# Patient Record
Sex: Male | Born: 2000 | Race: White | Hispanic: No | Marital: Single | Smoking: Never smoker
Health system: Southern US, Community
[De-identification: ages and names within clinical notes are randomized; demographics above are authoritative.]

## PROBLEM LIST (undated history)

## (undated) DIAGNOSIS — Q676 Pectus excavatum: Secondary | ICD-10-CM

## (undated) DIAGNOSIS — R55 Syncope and collapse: Secondary | ICD-10-CM

## (undated) DIAGNOSIS — F419 Anxiety disorder, unspecified: Secondary | ICD-10-CM

## (undated) DIAGNOSIS — R51 Headache: Secondary | ICD-10-CM

## (undated) DIAGNOSIS — F32A Depression, unspecified: Secondary | ICD-10-CM

## (undated) DIAGNOSIS — G47419 Narcolepsy without cataplexy: Secondary | ICD-10-CM

## (undated) DIAGNOSIS — F329 Major depressive disorder, single episode, unspecified: Secondary | ICD-10-CM

## (undated) HISTORY — DX: Depression, unspecified: F32.A

## (undated) HISTORY — DX: Headache: R51

## (undated) HISTORY — DX: Syncope and collapse: R55

## (undated) HISTORY — DX: Anxiety disorder, unspecified: F41.9

## (undated) HISTORY — DX: Narcolepsy without cataplexy: G47.419

## (undated) HISTORY — DX: Major depressive disorder, single episode, unspecified: F32.9

## (undated) HISTORY — DX: Pectus excavatum: Q67.6

---
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
## 2019-01-21 ENCOUNTER — Telehealth: Payer: Self-pay | Admitting: Family Medicine

## 2019-01-22 ENCOUNTER — Ambulatory Visit (INDEPENDENT_AMBULATORY_CARE_PROVIDER_SITE_OTHER): Payer: Managed Care, Other (non HMO) | Admitting: Family Medicine

## 2019-01-22 ENCOUNTER — Ambulatory Visit: Payer: Self-pay | Admitting: Family Medicine

## 2019-01-22 ENCOUNTER — Encounter: Payer: Self-pay | Admitting: Family Medicine

## 2019-01-22 VITALS — BP 131/61 | HR 79 | Temp 98.7°F | Resp 20 | Ht 70.5 in | Wt 184.0 lb

## 2019-01-22 DIAGNOSIS — IMO0002 Reserved for concepts with insufficient information to code with codable children: Secondary | ICD-10-CM

## 2019-01-22 DIAGNOSIS — F411 Generalized anxiety disorder: Secondary | ICD-10-CM | POA: Insufficient documentation

## 2019-01-22 DIAGNOSIS — Z915 Personal history of self-harm: Secondary | ICD-10-CM | POA: Diagnosis not present

## 2019-01-22 DIAGNOSIS — F339 Major depressive disorder, recurrent, unspecified: Secondary | ICD-10-CM | POA: Insufficient documentation

## 2019-01-22 MED ORDER — FLUOXETINE HCL 20 MG PO TABS: 20.0000 mg | ORAL_TABLET | Freq: Every day | ORAL | 3 refills | Status: DC

## 2019-01-22 NOTE — Progress Notes (Addendum)
Subjective:  Patient ID: Dustin Pace, male    DOB: [birth year], 18 y.o.   MRN: [id number]  Patient Care Team: Sonny Masters, FNP as PCP - General (Family Medicine)   Chief Complaint:  Medical Management of Chronic Issues (anxiety (attacks -more often -once every 2 wks ) )   HPI: Dustin Pace is a 18 y.o. male presenting on 01/22/2019 for Medical Management of Chronic Issues (anxiety (attacks -more often -once every 2 wks ) )  Pt presents today for follow up for anxiety and depression. Pt states he has been seen in the past by Dr. Oswaldo Pace and at [facility]. States the woman he was seeing at [facility] left and he did not feel comfortable with the new provider so he stopped going about 1 year ago. States he stopped taking his medications at this time as well. He feels he has had a significant increase in his symptoms. States he started having depression and anxiety symptoms around age 28. States his mother and father both have a history of the same. Not sure if parents are taking medications. He states his symptoms have become more frequent and are interfering with his daily life. States he feels helpless which leads to frustration, anxiety, and anger on a daily basis. He is able to maintain his full time job, he is currently working days and lives with a roommate who is also a Radio broadcast assistant. States his roommate is a great support person and is aware of his illness. States he also sees other friends at least every 1-2 weeks. He has online friends with whom he plays video games with.  He reports an incident which occurred about 1.5 years ago. States he was very sad and started drinking alcohol which increased his sadness. States he needed a release so he cut himself on the face and shoulder. He was not seen after this incident. States he has thoughts that it would be better if her were not here but denies suicidal plan or means of carrying out any plans. He states he feels the cutting episode  was more of a release for his and his anger, sadness, and frustrations. He denies recent cutting episodes. Pt voices safety to himself and others. No current SI or HI.   Depression      The patient presents with depression.  This is a recurrent problem.  The current episode started more than 1 year ago.   The problem has been gradually worsening since onset.  Associated symptoms include decreased concentration, fatigue, helplessness, hopelessness, insomnia, irritable, restlessness, decreased interest, appetite change and sad.  Associated symptoms include no body aches, no myalgias, no headaches, no indigestion and no suicidal ideas.     The symptoms are aggravated by family issues, work stress, medication withdrawal and social issues.  Past treatments include SSRIs - Selective serotonin reuptake inhibitors and other medications.  Compliance with treatment is poor.  Risk factors include family history, family history of mental illness, alcohol intake, a change in medication usage/dosage, history of self-injury, history of mental illness, the patient not taking medications correctly and stress.   Past medical history includes anxiety, depression and mental health disorder.   Anxiety Presents for follow-up visit. Symptoms include compulsions, decreased concentration, depressed mood, excessive worry, insomnia, irritability, nervous/anxious behavior and restlessness. Patient reports no chest pain, confusion, dizziness, dry mouth, feeling of choking, hyperventilation, impotence, malaise, muscle tension, nausea, obsessions, palpitations, panic, shortness of breath or suicidal ideas. The quality of sleep  is poor.   His past medical history is significant for depression. Compliance with medications is 0-25%.     Relevant past medical, surgical, family, and social history reviewed and updated as indicated.  Allergies and medications reviewed and updated. Date reviewed: Chart in Epic.   Past Medical History:    Diagnosis Date   Anxiety    Depression    Headache(784.0)    Narcolepsy    Pectus excavatum    Syncope and collapse     Past Surgical History:  Procedure Laterality Date   TONSILLECTOMY AND ADENOIDECTOMY N/A 07/26/2012   Procedure: TONSILLECTOMY AND ADENOIDECTOMY;  Surgeon: Osborn Coho, MD;  Location: [facility];  Service: ENT;  Laterality: N/A;    Social History   Socioeconomic History   Marital status: Single    Spouse name: Not on file   Number of children: Not on file   Years of education: Not on file   Highest education level: Not on file  Occupational History   Not on file  Social Needs   Financial resource strain: Not on file   Food insecurity    Worry: Not on file    Inability: Not on file   Transportation needs    Medical: Not on file    Non-medical: Not on file  Tobacco Use   Smoking status: Passive Smoke Exposure - Never Smoker   Smokeless tobacco: Never Used  Substance and Sexual Activity   Alcohol use: No   Drug use: No   Sexual activity: Never  Lifestyle   Physical activity    Days per week: Not on file    Minutes per session: Not on file   Stress: Not on file  Relationships   Social connections    Talks on phone: Not on file    Gets together: Not on file    Attends religious service: Not on file    Active member of club or organization: Not on file    Attends meetings of clubs or organizations: Not on file    Relationship status: Not on file   Intimate partner violence    Fear of current or ex partner: Not on file    Emotionally abused: Not on file    Physically abused: Not on file    Forced sexual activity: Not on file  Other Topics Concern   Not on file  Social History Narrative   Not on file    Outpatient Encounter Medications as of 01/22/2019  Medication Sig   FLUoxetine (PROZAC) 20 MG tablet Take 1 tablet (20 mg total) by mouth daily.   triamcinolone ointment (KENALOG) 0.1 % Apply 1  application topically 2 (two) times daily. (Patient not taking: Reported on 01/22/2019)   [DISCONTINUED] buPROPion (WELLBUTRIN XL) 150 MG 24 hr tablet Take 150 mg by mouth daily.   [DISCONTINUED] FLUoxetine (PROZAC) 20 MG capsule Take 2 capsules (40 mg total) by mouth daily.   [DISCONTINUED] Solriamfetol HCl (SUNOSI) 150 MG TABS Take 150 mg by mouth daily.   No facility-administered encounter medications on file as of 01/22/2019.     Allergies  Allergen Reactions   Nuvigil [Armodafinil] Other (See Comments)    Suicidal ideations   Other     Stimulants  - increase HR and BP     Review of Systems  Constitutional: Positive for appetite change, fatigue and irritability. Negative for activity change, chills and fever.  HENT: Negative.   Eyes: Negative.  Negative for photophobia and visual disturbance.  Respiratory:  Negative for cough, chest tightness and shortness of breath.   Cardiovascular: Negative for chest pain, palpitations and leg swelling.  Gastrointestinal: Negative for abdominal pain, blood in stool, constipation, diarrhea, nausea and vomiting.  Endocrine: Negative.   Genitourinary: Negative for decreased urine volume, difficulty urinating, dysuria, frequency, impotence and urgency.  Musculoskeletal: Negative for arthralgias and myalgias.  Skin: Negative.   Allergic/Immunologic: Negative.   Neurological: Negative for dizziness, tremors, seizures, syncope, facial asymmetry, speech difficulty, weakness, light-headedness, numbness and headaches.  Hematological: Negative.   Psychiatric/Behavioral: Positive for agitation, behavioral problems, decreased concentration, depression, dysphoric mood, self-injury and sleep disturbance. Negative for confusion and suicidal ideas. The patient is nervous/anxious, has insomnia and is hyperactive.   All other systems reviewed and are negative.  GAD 7 : Generalized Anxiety Score 01/22/2019 10/26/2017  Nervous, Anxious, on Edge 2 1  Control/stop  worrying 1 0  Worry too much - different things 2 0  Trouble relaxing 1 0  Restless 0 2  Easily annoyed or irritable 2 1  Afraid - awful might happen 0 0  Total GAD 7 Score 8 4  Anxiety Difficulty Somewhat difficult Somewhat difficult    Depression screen [facility] 2/9 01/22/2019 01/07/2018 10/26/2017 10/05/2017 03/02/2017  Decreased Interest 2 1 1 2 3   Down, Depressed, Hopeless 2 1 1 2 1   PHQ - 2 Score 4 2 2 4 4   Altered sleeping 2 1 1 2 2   Tired, decreased energy 3 3 3 3 3   Change in appetite 2 2 2 2  0  Feeling bad or failure about yourself  1 2 2 1  0  Trouble concentrating 2 2 2 1 2   Moving slowly or fidgety/restless 1 0 0 1 0  Suicidal thoughts 2 1 2 2  0  PHQ-9 Score 17 13 14 16 11   Difficult doing work/chores Somewhat difficult Somewhat difficult Somewhat difficult Somewhat difficult Somewhat difficult  Some recent data might be hidden        Objective:  BP 131/61    Pulse 79    Temp 98.7 F (37.1 C)    Resp 20    Ht 5' 10.5" (1.791 m)    Wt 184 lb (83.5 kg)    SpO2 100%    BMI 26.03 kg/m    Wt Readings from Last 3 Encounters:  01/22/19 184 lb (83.5 kg) (85 %, Z= 1.05)*  01/07/18 184 lb 3.2 oz (83.6 kg) (88 %, Z= 1.18)*  10/26/17 174 lb (78.9 kg) (82 %, Z= 0.92)*   * Growth percentiles are based on CDC (Boys, 2-20 Years) data.    Physical Exam Vitals signs and nursing note reviewed.  Constitutional:      General: He is irritable. He is not in acute distress.    Appearance: Normal appearance. He is well-developed and well-groomed. He is not ill-appearing, toxic-appearing or diaphoretic.  HENT:     Head: Normocephalic and atraumatic.     Jaw: There is normal jaw occlusion.     Right Ear: Hearing normal.     Left Ear: Hearing normal.     Nose: Nose normal.     Mouth/Throat:     Lips: Pink.     Mouth: Mucous membranes are moist.     Pharynx: Oropharynx is clear. Uvula midline.  Eyes:     General: Lids are normal.     Extraocular Movements: Extraocular movements  intact.     Conjunctiva/sclera: Conjunctivae normal.     Pupils: Pupils are equal, round, and reactive to light.  Neck:     Musculoskeletal: Normal range of motion and neck supple.     Thyroid: No thyroid mass, thyromegaly or thyroid tenderness.     Vascular: No carotid bruit or JVD.     Trachea: Trachea and phonation normal.  Cardiovascular:     Rate and Rhythm: Normal rate and regular rhythm.     Chest Wall: PMI is not displaced.     Pulses: Normal pulses.     Heart sounds: Normal heart sounds. No murmur. No friction rub. No gallop.   Pulmonary:     Effort: Pulmonary effort is normal. No respiratory distress.     Breath sounds: Normal breath sounds. No wheezing.  Abdominal:     General: Bowel sounds are normal. There is no distension or abdominal bruit.     Palpations: Abdomen is soft. There is no hepatomegaly or splenomegaly.     Tenderness: There is no abdominal tenderness. There is no right CVA tenderness or left CVA tenderness.     Hernia: No hernia is present.  Musculoskeletal: Normal range of motion.     Right lower leg: No edema.     Left lower leg: No edema.  Lymphadenopathy:     Cervical: No cervical adenopathy.  Skin:    General: Skin is warm and dry.     Capillary Refill: Capillary refill takes less than 2 seconds.     Coloration: Skin is not cyanotic, jaundiced or pale.     Findings: No rash.  Neurological:     General: No focal deficit present.     Mental Status: He is alert and oriented to person, place, and time.     Cranial Nerves: Cranial nerves are intact.     Sensory: Sensation is intact.     Motor: Motor function is intact.     Coordination: Coordination is intact.     Gait: Gait is intact.     Deep Tendon Reflexes: Reflexes are normal and symmetric.  Psychiatric:        Attention and Perception: Attention and perception normal.        Mood and Affect: Affect normal. Mood is depressed.        Speech: Speech normal.        Behavior: Behavior normal.  Behavior is cooperative.        Thought Content: Thought content does not include homicidal or suicidal ideation. Thought content does not include homicidal or suicidal plan.        Cognition and Memory: Cognition and memory normal.        Judgment: Judgment normal.     Comments: Open about concerns, symptoms, and previous treatments. Able to make eye contact and answer all questions. Denies risk of harm to self or others. No suicide plan or access to harmful weapons or medications in the home. Has a great support system. No thoughts of self harm at present. Does feel at times it would be better off if he were not here but no plan or thoughts of suicide.      Results for orders placed or performed in visit on 09/25/17  Comprehensive metabolic panel  Result Value Ref Range   Glucose 85 65 - 99 mg/dL   BUN 8 5 - 18 mg/dL   Creatinine, Ser 1.610.91 0.76 - 1.27 mg/dL   GFR calc non Af Amer CANCELED mL/min/1.73   GFR calc Af Amer CANCELED mL/min/1.73   BUN/Creatinine Ratio 9 (L) 10 - 22   Sodium 139 134 - 144  mmol/L   Potassium 4.8 3.5 - 5.2 mmol/L   Chloride 98 96 - 106 mmol/L   CO2 25 20 - 29 mmol/L   Calcium 9.5 8.9 - 10.4 mg/dL   Total Protein 7.2 6.0 - 8.5 g/dL   Albumin 4.8 3.5 - 5.5 g/dL   Globulin, Total 2.4 1.5 - 4.5 g/dL   Albumin/Globulin Ratio 2.0 1.2 - 2.2   Bilirubin Total 0.4 0.0 - 1.2 mg/dL   Alkaline Phosphatase 77 61 - 146 IU/L   AST 15 0 - 40 IU/L   ALT 13 0 - 30 IU/L       Pertinent labs & imaging results that were available during my care of the patient were reviewed by me and considered in my medical decision making.  Assessment & Plan:  Dustin Pace was seen today for medical management of chronic issues.  Diagnoses and all orders for this visit:  Depression, recurrent (HCC) GAD (generalized anxiety disorder) Ongoing and worsening symptoms. Has been without medications and counseling for about one year. Pt denies threat of harm to self or others. Has adequate  support system at home. Declined referral to psychiatry but placed due to reported symptoms and previous self-harm behaviors. Will check thyroid function today. Will reinitiate fluoxetine today. Pt provided numbers to several 24 hour crisis hotlines. Verbal contract for safety today. Follow up in 1-2 weeks. Pt aware to go to ED if he develops suicidal ideations. Medications as prescribed.  -     Thyroid Panel With TSH -     FLUoxetine (PROZAC) 20 MG tablet; Take 1 tablet (20 mg total) by mouth daily. -     Ambulatory referral to Psychiatry  Personal history of self-harm -     Ambulatory referral to Psychiatry  Pt refused referral to psychiatry or psychology, placed due to reported history of self harm and present complaints. Pt verbalizes safety to self and others. No suicide plan or availability of harmful weapons or medications.   Continue all other maintenance medications.  Follow up plan: Return in about 1 week (around 01/29/2019), or if symptoms worsen or fail to improve.  Continue healthy lifestyle choices, including diet (rich in fruits, vegetables, and lean proteins, and low in salt and simple carbohydrates) and exercise (at least 30 minutes of moderate physical activity daily).  Educational handout given for depression, crisis line numbers  The above assessment and management plan was discussed with the patient. The patient verbalized understanding of and has agreed to the management plan. Patient is aware to call the clinic if they develop any new symptoms or if symptoms persist or worsen. Patient is aware when to return to the clinic for a follow-up visit. Patient educated on when it is appropriate to go to the emergency department.   Kari Baars, FNP-C [facility] [phone]

## 2019-01-22 NOTE — Patient Instructions (Signed)
If your symptoms worsen or you have thoughts of suicide/homicide, PLEASE SEEK IMMEDIATE MEDICAL ATTENTION.  You may always call the National Suicide Hotline.  This is available 24 hours a day, 7 days a week.  Their number is: 1-800-273-8255  Taking the medicine as directed and not missing any doses is one of the best things you can do to treat your depression.  Here are some things to keep in mind:  1) Side effects (stomach upset, some increased anxiety) may happen before you notice a benefit.  These side effects typically go away over time. 2) Changes to your dose of medicine or a change in medication all together is sometimes necessary 3) Most people need to be on medication at least 12 months 4) Many people will notice an improvement within two weeks but the full effect of the medication can take up to 4-6 weeks 5) Stopping the medication when you start feeling better often results in a return of symptoms 6) Never discontinue your medication without contacting a health care professional first.  Some medications require gradual discontinuation/ taper and can make you sick if you stop them abruptly.  If your symptoms worsen or you have thoughts of suicide/homicide, PLEASE SEEK IMMEDIATE MEDICAL ATTENTION.  You may always call:  National Suicide Hotline: 800-273-8255 [place] Crisis Line: 336-832-9700 Crisis Recovery in [place]: 800-939-5911   These are available 24 hours a day, 7 days a week.   

## 2019-01-23 LAB — THYROID PANEL WITH TSH
Free Thyroxine Index: 2 (ref 1.2–4.9)
T3 Uptake Ratio: 31 % (ref 24–39)
T4, Total: 6.4 ug/dL (ref 4.5–12.0)
TSH: 0.955 u[IU]/mL (ref 0.450–4.500)

## 2019-01-31 ENCOUNTER — Other Ambulatory Visit: Payer: Self-pay

## 2019-02-03 ENCOUNTER — Encounter: Payer: Self-pay | Admitting: Family Medicine

## 2019-02-03 ENCOUNTER — Ambulatory Visit: Payer: Managed Care, Other (non HMO) | Admitting: Family Medicine

## 2019-02-03 ENCOUNTER — Other Ambulatory Visit: Payer: Self-pay

## 2019-02-03 VITALS — BP 121/72 | HR 60 | Temp 98.6°F | Resp 18 | Ht 70.5 in | Wt 182.0 lb

## 2019-02-03 DIAGNOSIS — F339 Major depressive disorder, recurrent, unspecified: Secondary | ICD-10-CM

## 2019-02-03 DIAGNOSIS — F411 Generalized anxiety disorder: Secondary | ICD-10-CM | POA: Diagnosis not present

## 2019-02-03 MED ORDER — FLUOXETINE HCL 40 MG PO CAPS: 40.0000 mg | ORAL_CAPSULE | Freq: Every day | ORAL | 3 refills | Status: DC

## 2019-02-03 NOTE — Patient Instructions (Signed)
If your symptoms worsen or you have thoughts of suicide/homicide, PLEASE SEEK IMMEDIATE MEDICAL ATTENTION.  You may always call the National Suicide Hotline.  This is available 24 hours a day, 7 days a week.  Their number is: 1-800-273-8255  Taking the medicine as directed and not missing any doses is one of the best things you can do to treat your depression.  Here are some things to keep in mind:  1) Side effects (stomach upset, some increased anxiety) may happen before you notice a benefit.  These side effects typically go away over time. 2) Changes to your dose of medicine or a change in medication all together is sometimes necessary 3) Most people need to be on medication at least 12 months 4) Many people will notice an improvement within two weeks but the full effect of the medication can take up to 4-6 weeks 5) Stopping the medication when you start feeling better often results in a return of symptoms 6) Never discontinue your medication without contacting a health care professional first.  Some medications require gradual discontinuation/ taper and can make you sick if you stop them abruptly.  If your symptoms worsen or you have thoughts of suicide/homicide, PLEASE SEEK IMMEDIATE MEDICAL ATTENTION.  You may always call:  National Suicide Hotline: 800-273-8255 [place] Crisis Line: 336-832-9700 Crisis Recovery in [place]: 800-939-5911   These are available 24 hours a day, 7 days a week.   

## 2019-02-03 NOTE — Progress Notes (Signed)
Subjective:  Patient ID: Dustin Pace, male    DOB: 2000/09/12, 18 y.o.   MRN: [id number]  Patient Care Team: Baruch Gouty, FNP as PCP - General (Family Medicine)   Chief Complaint:  Anxiety   HPI: Dustin Pace is a 18 y.o. male presenting on 02/03/2019 for Anxiety   2 week reevaluation after initiation of fluoxetine for anxiety and depression. Pt states he does not feel as if symptoms have improved or worsened. States he can not tell any difference. He continues to have suicidal thoughts, no active plan or means of hurting self, states he just feels it would be better off if he were not here. No self harm thoughts or behaviors. He has a great support system in his roommate who is aware of his depression and anxiety. He states he can talk to his roommate about anything and is able to go to them if things get bad. He denies previous psychiatry admission. He still declines psychiatry. He is taking medications as prescribed without associated side effects. He states his anxiety and depression are worse when he is alone and has to deal with his thoughts. He denies a hobby or activity that he enjoys to do to occupy his down time.   Anxiety Presents for follow-up visit. Symptoms include compulsions, decreased concentration, depressed mood, excessive worry, insomnia, irritability, nervous/anxious behavior, obsessions and restlessness. Patient reports no chest pain, confusion, dizziness, dry mouth, feeling of choking, hyperventilation, impotence, malaise, muscle tension, nausea, palpitations, panic, shortness of breath or suicidal ideas. Symptoms occur most days. The severity of symptoms is moderate and interfering with daily activities. The quality of sleep is fair. Nighttime awakenings: occasional.   His past medical history is significant for depression. Compliance with medications is 76-100%.  Depression      The patient presents with depression.  This is a recurrent problem.  The  current episode started more than 1 year ago.   The problem occurs daily.The problem is unchanged.  Associated symptoms include decreased concentration, fatigue, helplessness, hopelessness, insomnia, irritable, restlessness, decreased interest, appetite change and sad.  Associated symptoms include no body aches, no myalgias, no headaches, no indigestion and no suicidal ideas.     The symptoms are aggravated by work stress, family issues and social issues.  Past treatments include psychotherapy and SSRIs - Selective serotonin reuptake inhibitors.  Compliance with treatment is good.  Previous treatment provided no relief relief.  Risk factors include history of mental illness, family history of mental illness, family history and history of self-injury.   Past medical history includes anxiety and depression.    GAD 7 : Generalized Anxiety Score 02/03/2019 01/22/2019 10/26/2017  Nervous, Anxious, on Edge 1 2 1   Control/stop worrying 1 1 0  Worry too much - different things 2 2 0  Trouble relaxing 1 1 0  Restless 1 0 2  Easily annoyed or irritable 1 2 1   Afraid - awful might happen 1 0 0  Total GAD 7 Score 8 8 4   Anxiety Difficulty Somewhat difficult Somewhat difficult Somewhat difficult   Depression screen [facility] 2/9 02/03/2019 01/22/2019 01/07/2018 10/26/2017 10/05/2017  Decreased Interest 2 2 1 1 2   Down, Depressed, Hopeless 2 2 1 1 2   PHQ - 2 Score 4 4 2 2 4   Altered sleeping 2 2 1 1 2   Tired, decreased energy 3 3 3 3 3   Change in appetite 2 2 2 2 2   Feeling bad or failure about yourself  Trouble concentrating Moving slowly or fidgety/restless 1 1 0 0 1  Suicidal thoughts PHQ-9 Score Difficult doing work/chores Somewhat difficult Somewhat difficult Somewhat difficult Somewhat difficult Somewhat difficult  Some recent data might be hidden     Relevant past medical, surgical, family, and social history reviewed and updated as indicated.  Allergies  and medications reviewed and updated. Date reviewed: Chart in Epic.   Past Medical History:  Diagnosis Date  . Anxiety   . Depression   . Headache(784.0)   . Narcolepsy   . Pectus excavatum   . Syncope and collapse     Past Surgical History:  Procedure Laterality Date  . TONSILLECTOMY AND ADENOIDECTOMY N/A 07/26/2012   Procedure: TONSILLECTOMY AND ADENOIDECTOMY;  Surgeon: Osborn Coho, MD;  Location: [facility];  Service: ENT;  Laterality: N/A;    Social History   Socioeconomic History  . Marital status: Single    Spouse name: Not on file  . Number of children: Not on file  . Years of education: Not on file  . Highest education level: Not on file  Occupational History  . Not on file  Social Needs  . Financial resource strain: Not on file  . Food insecurity    Worry: Not on file    Inability: Not on file  . Transportation needs    Medical: Not on file    Non-medical: Not on file  Tobacco Use  . Smoking status: Passive Smoke Exposure - Never Smoker  . Smokeless tobacco: Never Used  Substance and Sexual Activity  . Alcohol use: No  . Drug use: No  . Sexual activity: Never  Lifestyle  . Physical activity    Days per week: Not on file    Minutes per session: Not on file  . Stress: Not on file  Relationships  . Social Musician on phone: Not on file    Gets together: Not on file    Attends religious service: Not on file    Active member of club or organization: Not on file    Attends meetings of clubs or organizations: Not on file    Relationship status: Not on file  . Intimate partner violence    Fear of current or ex partner: Not on file    Emotionally abused: Not on file    Physically abused: Not on file    Forced sexual activity: Not on file  Other Topics Concern  . Not on file  Social History Narrative  . Not on file    Outpatient Encounter Medications as of 02/03/2019  Medication Sig  . triamcinolone ointment (KENALOG)  0.1 % Apply 1 application topically 2 (two) times daily.  . [DISCONTINUED] FLUoxetine (PROZAC) 20 MG tablet Take 1 tablet (20 mg total) by mouth daily.  Marland Kitchen FLUoxetine (PROZAC) 40 MG capsule Take 1 capsule (40 mg total) by mouth daily.   No facility-administered encounter medications on file as of 02/03/2019.     Allergies  Allergen Reactions  . Nuvigil [Armodafinil] Other (See Comments)    Suicidal ideations  . Other     Stimulants  - increase HR and BP     Review of Systems  Constitutional: Positive for appetite change, fatigue and irritability. Negative for activity change, chills, diaphoresis, fever and unexpected weight change.  HENT: Negative.   Eyes: Negative.  Respiratory: Negative for cough, chest tightness and shortness of breath.   Cardiovascular: Negative for chest pain, palpitations and leg swelling.  Gastrointestinal: Negative for abdominal distention, abdominal pain, blood in stool, constipation, diarrhea, nausea and vomiting.  Endocrine: Negative.   Genitourinary: Negative for decreased urine volume, difficulty urinating, dysuria, frequency, impotence and urgency.  Musculoskeletal: Negative for arthralgias and myalgias.  Skin: Negative.  Negative for color change, pallor, rash and wound.  Allergic/Immunologic: Negative.   Neurological: Negative for dizziness, weakness and headaches.  Hematological: Negative.   Psychiatric/Behavioral: Positive for agitation, decreased concentration, depression and dysphoric mood. Negative for behavioral problems, confusion, hallucinations, self-injury, sleep disturbance and suicidal ideas. The patient is nervous/anxious and has insomnia. The patient is not hyperactive.   All other systems reviewed and are negative.       Objective:  BP 121/72   Pulse 60   Temp 98.6 F (37 C)   Resp 18   Ht 5' 10.5" (1.791 m)   Wt 182 lb (82.6 kg)   SpO2 97%   BMI 25.75 kg/m    Wt Readings from Last 3 Encounters:  02/03/19 182 lb (82.6 kg)  (84 %, Z= 0.99)*  01/22/19 184 lb (83.5 kg) (85 %, Z= 1.05)*  01/07/18 184 lb 3.2 oz (83.6 kg) (88 %, Z= 1.18)*   * Growth percentiles are based on CDC (Boys, 2-20 Years) data.    Physical Exam Vitals signs and nursing note reviewed.  Constitutional:      General: He is irritable. He is not in acute distress.    Appearance: Normal appearance. He is well-developed and well-groomed. He is not ill-appearing, toxic-appearing or diaphoretic.  HENT:     Head: Normocephalic and atraumatic.     Jaw: There is normal jaw occlusion.     Right Ear: Hearing normal.     Left Ear: Hearing normal.     Nose: Nose normal.     Mouth/Throat:     Lips: Pink.     Mouth: Mucous membranes are moist.     Pharynx: Oropharynx is clear. Uvula midline.  Eyes:     General: Lids are normal.     Extraocular Movements: Extraocular movements intact.     Conjunctiva/sclera: Conjunctivae normal.     Pupils: Pupils are equal, round, and reactive to light.  Neck:     Musculoskeletal: Normal range of motion and neck supple.     Thyroid: No thyroid mass, thyromegaly or thyroid tenderness.     Vascular: No carotid bruit or JVD.     Trachea: Trachea and phonation normal.  Cardiovascular:     Rate and Rhythm: Normal rate and regular rhythm.     Chest Wall: PMI is not displaced.     Pulses: Normal pulses.     Heart sounds: Normal heart sounds. No murmur. No friction rub. No gallop.   Pulmonary:     Effort: Pulmonary effort is normal. No respiratory distress.     Breath sounds: Normal breath sounds. No wheezing.  Abdominal:     General: Bowel sounds are normal. There is no distension or abdominal bruit.     Palpations: Abdomen is soft. There is no hepatomegaly or splenomegaly.     Tenderness: There is no abdominal tenderness. There is no right CVA tenderness or left CVA tenderness.     Hernia: No hernia is present.  Musculoskeletal: Normal range of motion.     Right lower leg: No edema.     Left lower leg: No  edema.  Lymphadenopathy:     Cervical: No cervical adenopathy.  Skin:    General: Skin is warm and dry.     Capillary Refill: Capillary refill takes less than 2 seconds.     Coloration: Skin is not cyanotic, jaundiced or pale.     Findings: No rash.  Neurological:     General: No focal deficit present.     Mental Status: He is alert and oriented to person, place, and time.     Cranial Nerves: Cranial nerves are intact.     Sensory: Sensation is intact.     Motor: Motor function is intact.     Coordination: Coordination is intact.     Gait: Gait is intact.     Deep Tendon Reflexes: Reflexes are normal and symmetric.  Psychiatric:        Attention and Perception: Attention and perception normal.        Mood and Affect: Mood and affect normal.        Speech: Speech normal.        Behavior: Behavior normal. Behavior is cooperative.        Thought Content: Thought content is not paranoid or delusional. Thought content does not include homicidal or suicidal ideation. Thought content does not include homicidal or suicidal plan.        Cognition and Memory: Cognition and memory normal.        Judgment: Judgment normal.     Comments: Thoughts it would be better off if he were not here but no suicide plan or ideations.      Results for orders placed or performed in visit on 01/22/19  Thyroid Panel With TSH  Result Value Ref Range   TSH 0.955 0.450 - 4.500 uIU/mL   T4, Total 6.4 4.5 - 12.0 ug/dL   T3 Uptake Ratio 31 24 - 39 %   Free Thyroxine Index 2.0 1.2 - 4.9       Pertinent labs & imaging results that were available during my care of the patient were reviewed by me and considered in my medical decision making.  Assessment & Plan:  Dustin Pace was seen today for anxiety.  Diagnoses and all orders for this visit:  Depression, recurrent (HCC) GAD (generalized anxiety disorder) Pt verbalizes safety to self and access to adequate support 24 hours per day. No suicide plan. Long  discussion about need for psychiatry follow up. Pt denies at this time. States he does not feel this will be beneficial. Discussed the need for a hobby or activity to do when alone discussed possible coloring, drawing, or writing. Will increase fluoxetine to 40 mg daily. Pt aware to report any new, worsening, or persistent symptoms. Pt aware of symptoms that require emergent evaluation and treatment. Crisis hotline numbers provided. Follow up in 4 weeks or sooner if needed.  -     FLUoxetine (PROZAC) 40 MG capsule; Take 1 capsule (40 mg total) by mouth daily.    Continue all other maintenance medications.  Follow up plan: Return in about 4 weeks (around 03/03/2019), or if symptoms worsen or fail to improve, for GAD, Depression.  Continue healthy lifestyle choices, including diet (rich in fruits, vegetables, and lean proteins, and low in salt and simple carbohydrates) and exercise (at least 30 minutes of moderate physical activity daily).  Educational handout given for depression   The above assessment and management plan was discussed with the patient. The patient verbalized understanding of and has agreed to the management plan. Patient is aware  to call the clinic if they develop any new symptoms or if symptoms persist or worsen. Patient is aware when to return to the clinic for a follow-up visit. Patient educated on when it is appropriate to go to the emergency department.   Monia Pouch, FNP-C [facility] [phone]

## 2019-02-06 ENCOUNTER — Other Ambulatory Visit: Payer: Self-pay

## 2019-02-06 DIAGNOSIS — Z20822 Contact with and (suspected) exposure to covid-19: Secondary | ICD-10-CM

## 2019-02-08 LAB — NOVEL CORONAVIRUS, NAA: SARS-CoV-2, NAA: NOT DETECTED

## 2019-03-05 ENCOUNTER — Ambulatory Visit: Payer: Managed Care, Other (non HMO) | Admitting: Family Medicine

## 2019-03-06 ENCOUNTER — Encounter: Payer: Self-pay | Admitting: Family Medicine

## 2019-08-03 IMAGING — CR DG CHEST 2V
2 series · 2 of 2 positions shown · non-contrast
Comparison: Chest radiograph dated 04/21/2015

CLINICAL DATA: 15-year-old male with chest pain.

EXAM:
CHEST  2 VIEW

[w chest pa]
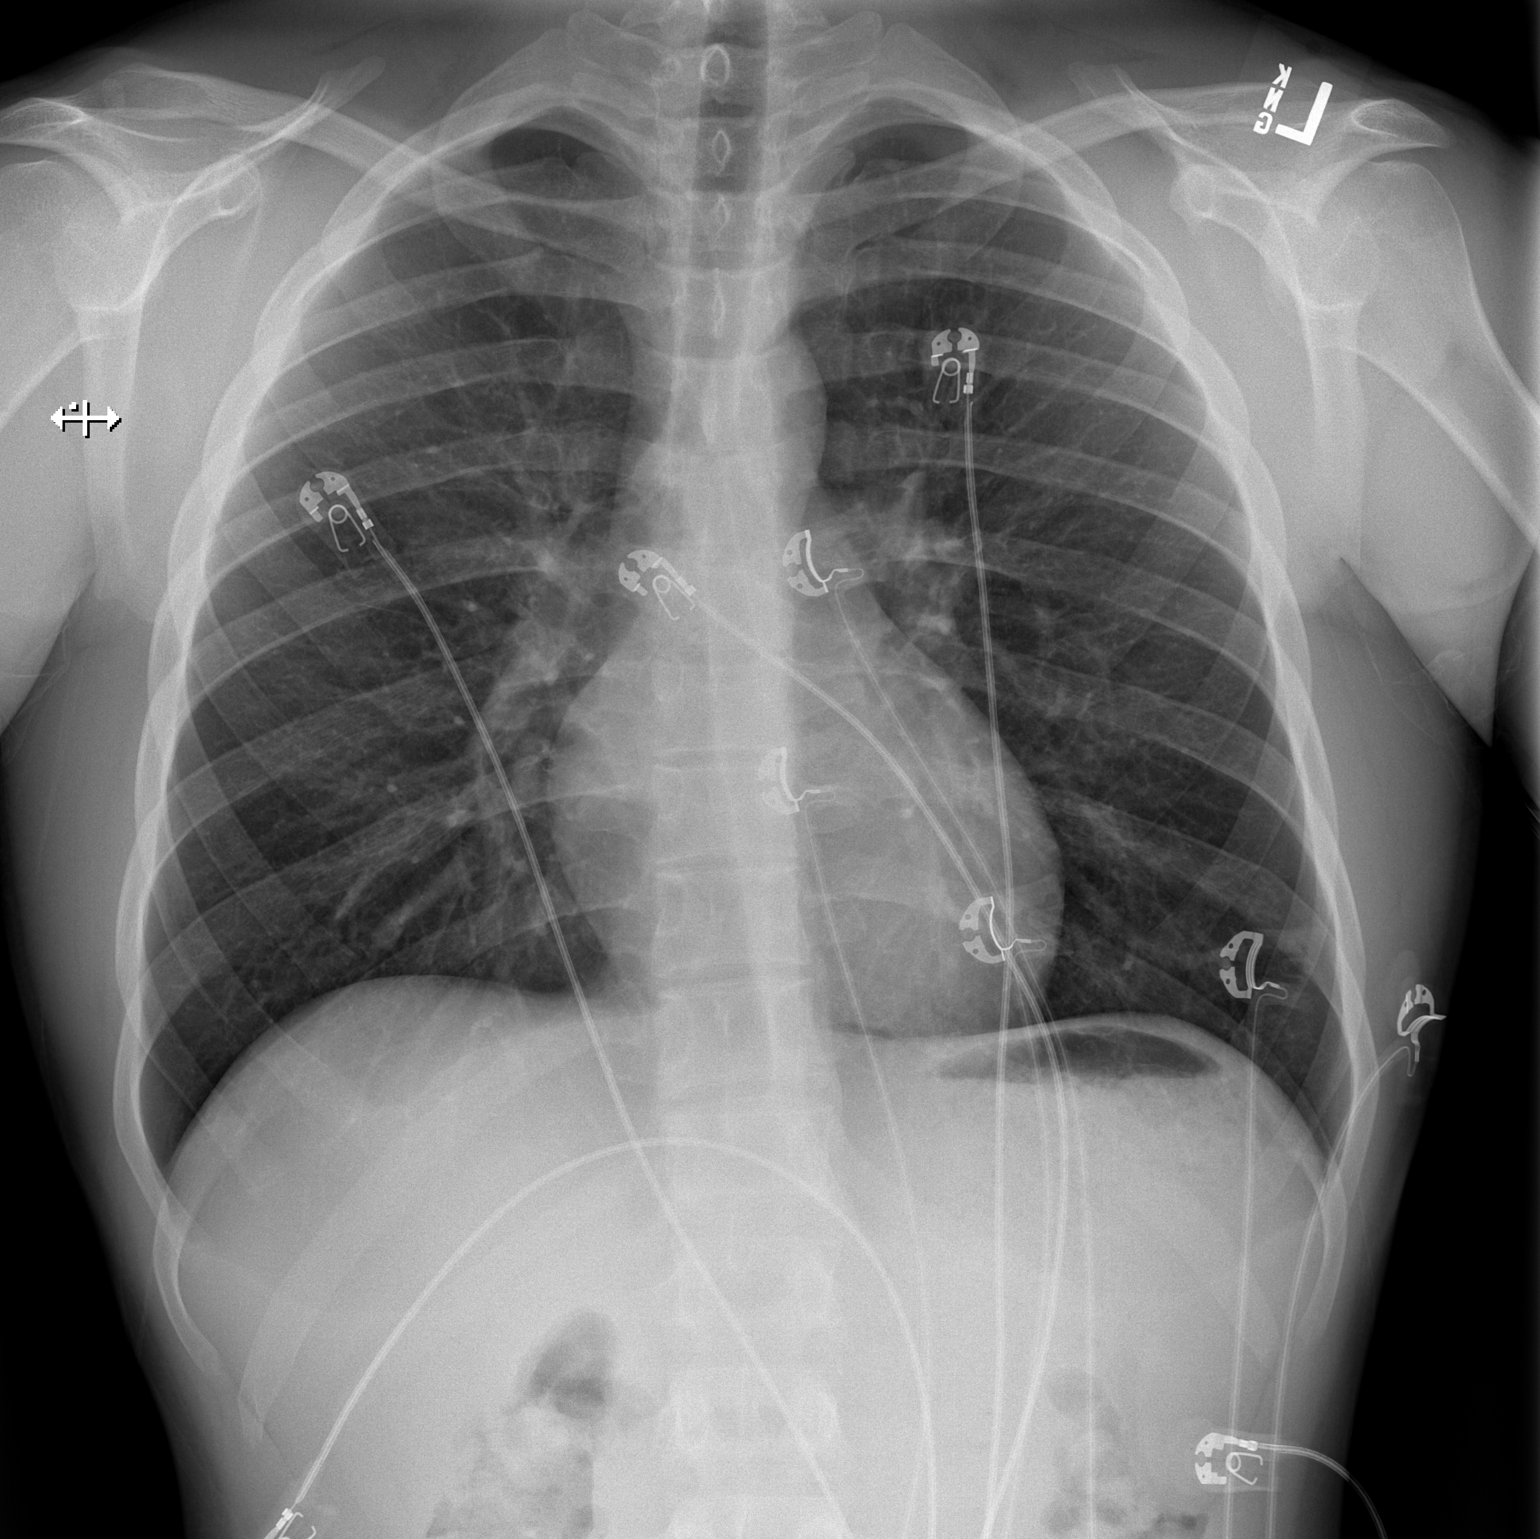

[w chest lat]
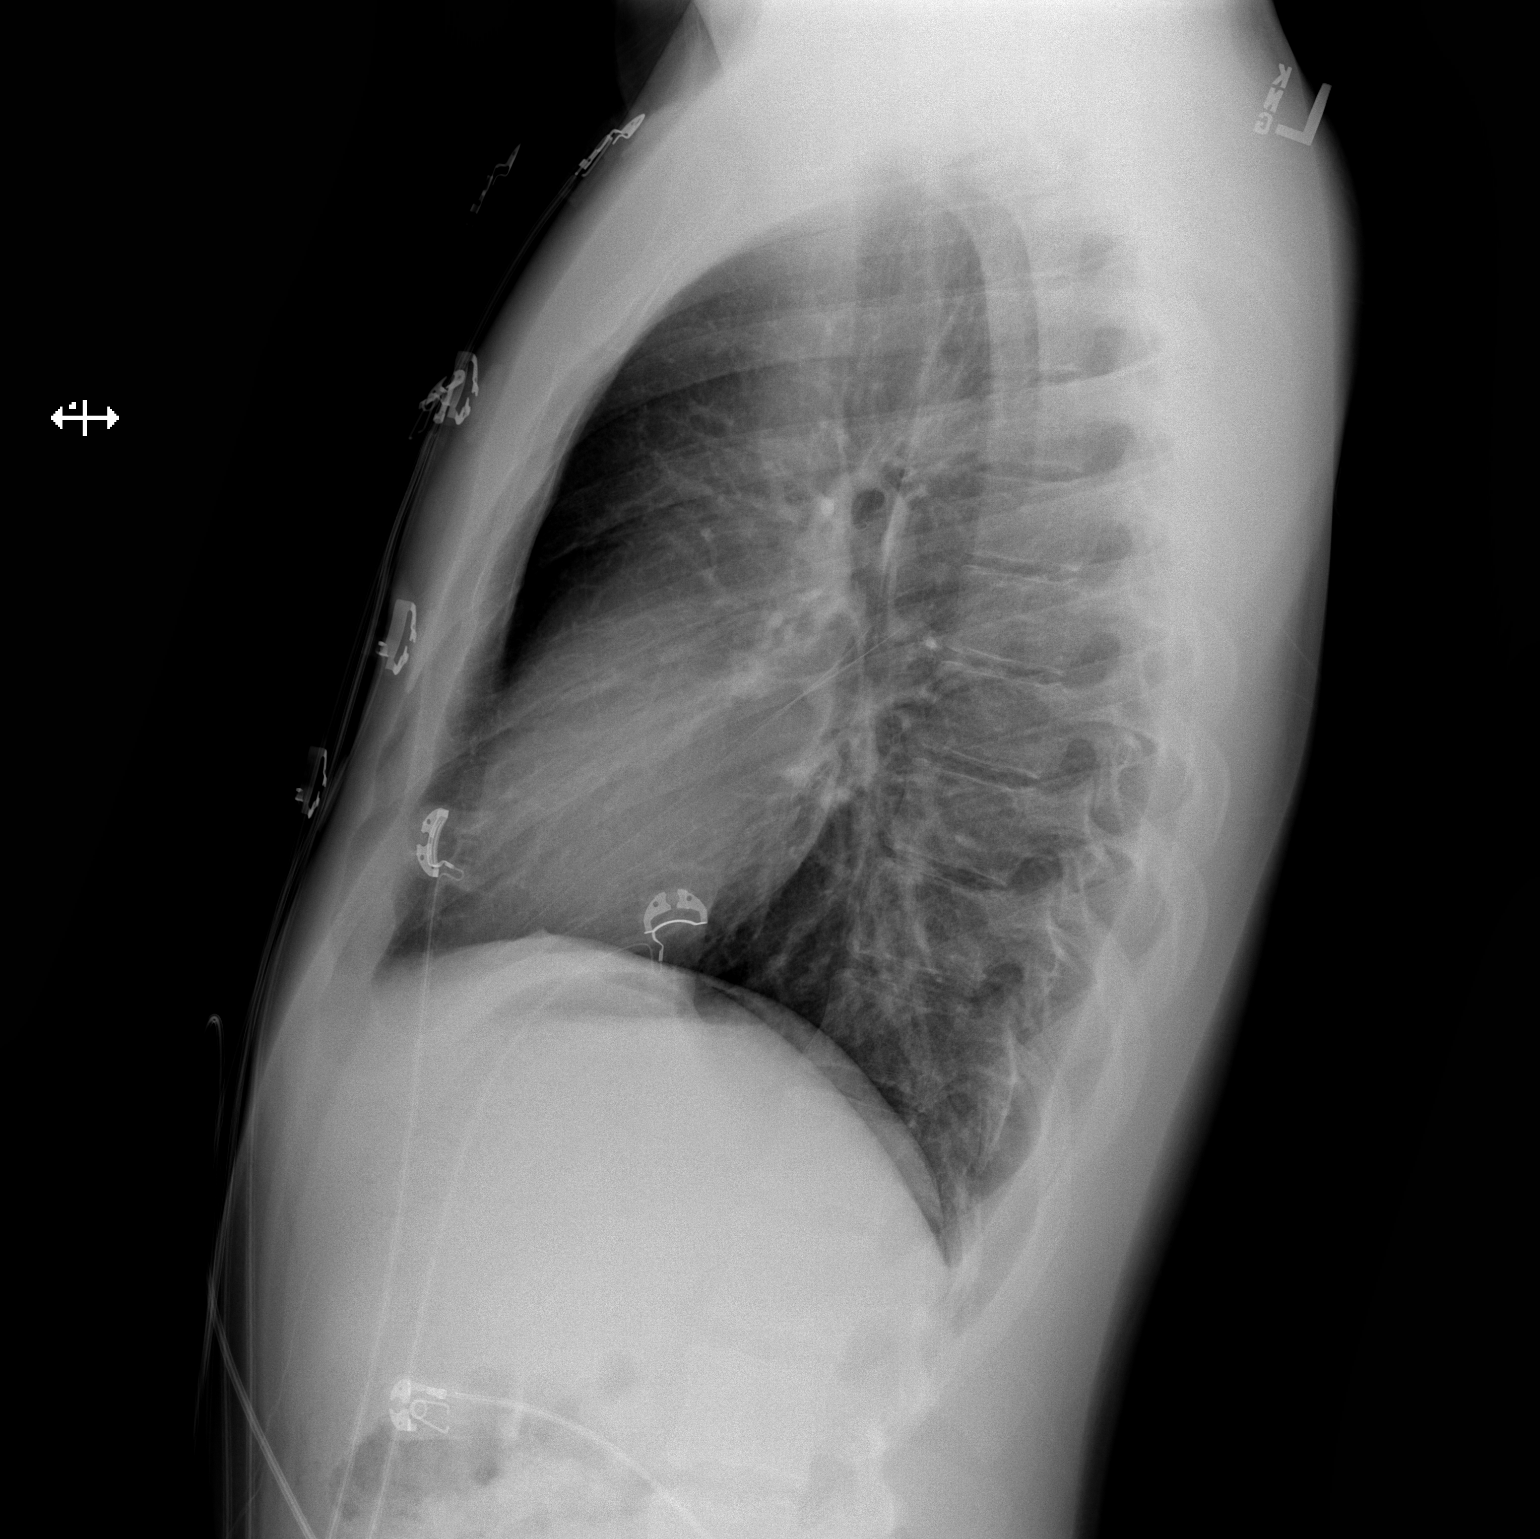

[2 of 2 positions shown; findings below may reference images not displayed]

FINDINGS: The heart size and mediastinal contours are within normal limits.
Both lungs are clear. The visualized skeletal structures are
unremarkable.
IMPRESSION: No active cardiopulmonary disease.

## 2019-09-29 ENCOUNTER — Ambulatory Visit: Payer: Managed Care, Other (non HMO) | Admitting: Neurology

## 2019-09-29 ENCOUNTER — Telehealth: Payer: Self-pay

## 2019-09-29 NOTE — Telephone Encounter (Signed)
I contacted the pt and left vm in regards to his 230 appt that was scheduled for this afternoon (09/29/2019).  Dr. Vickey Huger is not going to be in the office at this time due to a family service.  This appt time should not have been available.  I have called the pt and left a vm advising him to call back and reschedule. I apologized for any inconvenience this may have caused.

## 2019-10-21 ENCOUNTER — Encounter: Payer: Self-pay | Admitting: Neurology

## 2019-10-21 ENCOUNTER — Telehealth: Payer: Self-pay | Admitting: Neurology

## 2019-10-21 ENCOUNTER — Ambulatory Visit: Payer: Managed Care, Other (non HMO) | Admitting: Neurology

## 2019-10-21 VITALS — BP 114/61 | HR 56 | Ht 70.5 in | Wt 189.0 lb

## 2019-10-21 DIAGNOSIS — G47411 Narcolepsy with cataplexy: Secondary | ICD-10-CM

## 2019-10-21 DIAGNOSIS — G478 Other sleep disorders: Secondary | ICD-10-CM | POA: Diagnosis not present

## 2019-10-21 DIAGNOSIS — F518 Other sleep disorders not due to a substance or known physiological condition: Secondary | ICD-10-CM

## 2019-10-21 DIAGNOSIS — G4719 Other hypersomnia: Secondary | ICD-10-CM | POA: Diagnosis not present

## 2019-10-21 MED ORDER — XYREM 500 MG/ML PO SOLN: ORAL | 0 refills | Status: DC

## 2019-10-21 NOTE — Telephone Encounter (Signed)
PA completed for the patient for Xyrem through cover my meds/[employer]. Faxed SS and office notes to [facility]  PHX:[id number] Can take up to 5 days before hearing a response.

## 2019-10-21 NOTE — Patient Instructions (Signed)
Sodium Oxybate oral solution What is this medicine? SODIUM OXYBATE (SOE dee um OX i bate) is used to treat excessive sleepiness and cataplexy in patients with narcolepsy. Cataplexy causes a sudden muscle weakness due to a strong emotional response. This medicine may be used for other purposes; ask your healthcare provider or pharmacist if you have questions. This medicine may be used for other purposes; ask your health care provider or pharmacist if you have questions. COMMON BRAND NAME(S): Xyrem What should I tell my health care provider before I take this medicine? They need to know if you have any of these conditions:  depression  diet low in salt  heart disease  high blood pressure  history of drug or alcohol abuse problem  if you drink alcohol  kidney disease  liver disease  lung or breathing disease, like sleep apnea  mental illness  succinic semialdehyde dehydrogenase deficiency  suicidal thoughts, plans, or attempt; a previous suicide attempt by you or a family member  an unusual or allergic reaction to oxybate, other medicines, foods, dyes, or preservatives  pregnant or trying to get pregnant  breast-feeding How should I use this medicine? Take this medicine by mouth. Follow the directions on the prescription label. Use a specially marked oral syringe, spoon, or dropper to measure each dose. Ask your pharmacist if you do not have one. Household spoons are not accurate. Mix the dose with water as directed. Take this medicine on an empty stomach, at least 2 hours after food. Take your medicine at regular intervals. Do not take it more often than directed. Do not stop taking except on your doctor's advice. A special MedGuide will be given to you by the pharmacist with each prescription and refill. Be sure to read this information carefully each time. Talk to your pediatrician regarding the use of this medicine in children. While this drug may be prescribed for children  as young as 7 years for selected conditions, precautions do apply. Overdosage: If you think you have taken too much of this medicine contact a poison control center or emergency room at once. NOTE: This medicine is only for you. Do not share this medicine with others. What if I miss a dose? If you miss a dose, skip it. Take your next dose at the normal time. Do not take extra or 2 doses at the same time to make up for the missed dose. What may interact with this medicine? Do not take this medicine with any of the following medications:  alcohol  medicines for sleep This medicine may also interact with the following medications:  antihistamines for allergy, cough, and cold  certain medicines for depression, like amitriptyline, fluoxetine, sertraline  certain medicines for seizures like phenobarbital, primidone  divalproex sodium  general anesthetics like halothane, isoflurane, methoxyflurane, propofol  medicines that relax muscles for surgery  narcotic medicines for pain  phenothiazines like chlorpromazine, mesoridazine, prochlorperazine, thioridazine  valproate or valproic acid This list may not describe all possible interactions. Give your health care provider a list of all the medicines, herbs, non-prescription drugs, or dietary supplements you use. Also tell them if you smoke, drink alcohol, or use illegal drugs. Some items may interact with your medicine. What should I watch for while using this medicine? Visit your doctor or health care professional for regular checks on your progress. Tell your healthcare professional if your symptoms do not start to get better or if they get worse. This medicine has a risk of abuse and dependence. Your   health care provider will check you for this while you take this medicine. You may get drowsy or dizzy. This medicine causes sleep very quickly. You should only take your first dose at bedtime, while in bed. The second dose should be taken 2.5  to 4 hours after your first dose. Do not drive, use machinery, or do anything that needs mental alertness for at least 6 hours after taking this drug. Do not stand up or sit up quickly, especially if you are an older patient. This reduces the risk of dizzy or fainting spells. Alcohol may interfere with the effect of this medicine. Avoid alcoholic drinks. After taking this medicine, you may get up out of bed and do an activity that you do not know you are doing. The next morning, you may have no memory of this. Activities include driving a car ("sleep-driving"), making and eating food, talking on the phone, sexual activity, and sleep-walking. Serious injuries have occurred. Call your doctor right away if you find out you have done any of these activities. Do not take this medicine if you have used alcohol that evening. Do not take it if you have taken another medicine for sleep. The risk of doing these sleep-related activities is higher. If you or your family notice any changes in your behavior, such as new or worsening depression, thoughts of harming yourself, anxiety, other unusual or disturbing thoughts, or memory loss, call your healthcare professional right away. What side effects may I notice from receiving this medicine? Side effects that you should report to your doctor or health care professional as soon as possible:  allergic reactions like skin rash, itching or hives, swelling of the face, lips, or tongue  anxiety  breathing problems  confusion  hallucinations  seizures  signs and symptoms of low blood pressure like dizziness; feeling faint or lightheaded, falls; unusually weak or tired  sleepwalking  suicidal thoughts, mood changes  vomiting Side effects that usually do not require medical attention (report to your doctor or health care professional if they continue or are bothersome):  bedwetting  dizziness  drowsiness  headache  loss of appetite  nausea This list  may not describe all possible side effects. Call your doctor for medical advice about side effects. You may report side effects to FDA at 1-800-FDA-1088. Where should I keep my medicine? Keep out of the reach of children. This medicine can be abused. Keep your medicine in a safe place to protect it from theft. Do not share this medicine with anyone. Selling or giving away this medicine is dangerous and against the law. Store at room temperature between 15 and 30 degrees C (59 and 86 degrees F). Keep this medicine in the original container. Throw away any unused medicine after the expiration date. This medicine may cause accidental overdose and death if it is taken by other adults, children, or pets. Flush any unused medicine down the toilet or empty down the sink drain to reduce the chance of harm. Do not use the medicine after the expiration date. After preparing a dose of this medicine with water, the medicine should be taken within 24 hours or emptied down the sink drain. NOTE: This sheet is a summary. It may not cover all possible information. If you have questions about this medicine, talk to your doctor, pharmacist, or health care provider.  2020 Elsevier/Gold Standard (2018-11-13 11:21:52)

## 2019-10-21 NOTE — Progress Notes (Signed)
Name:  Dustin Pace, Dustin Pace Reference [id number]  Study Date: 08/09/2017 Procedure #: 1984  DOB: [birth year]    Protocol This is a 13 channel Multiple Sleep Latency Test comprised of 5 channels of EEG (T3-Cz, Cz-T4, F4-M1, C4-M1, O2-M1), 3 channels of Chin EMG, 4 channels of EOG and 1 channel for ECG.   All channels were sampled at 256 hz.    This polysomnographic procedure is designed to evaluate (1) the complaint of excessive daytime sleepiness by quantifying the time required to fall asleep and (2) the possibility of narcolepsy by checking for abnormally short latencies to REM sleep.  Electrographic variables include EEG, EMG, EOG and ECG.  Patients are monitored throughout four or five 20-minute opportunities to sleep (naps) at two-hour intervals.  For each nap, the patient is allowed 20 minutes to fall asleep.  Once asleep, the patient is awakened after 15 minutes.  Between naps, the patient is kept as alert as possible.  A sleep latency of 20 minutes indicates that no sleep occurred.  Parametric Analysis  Total Number of Naps 4     NAP # Time of Nap  Sleep Latency (mins) REM Latency (mins) Sleep Time Percent Awake Time Percent  1 08:57 4.0 14.0    2 08:58 3.5 0  ![place]   3 10:55 9.0 0     4 12:57 3.0 15.0    5 Not needed   MSLT Summary of Naps  Mean Sleep Latency to First Four Naps: 4.8    Results from Preceding PSG Study  Sleep Onset Time 22:44 Sleep Efficiency (%) 79.2  Rise Time 05:18 Sleep Latency (min) 87.5  Total Sleep Time  381 REM Latency (min) 161.5        [facility]   Provider:  Larey Seat, M D  Primary Care Physician:  Baruch Gouty, FNP   Referring Provider: Lavonna Monarch, LPGA   Chief Complaint  Patient presents with  . Follow-up    pt alone, rm 10. pt states that he has been off meds due to side effects but the sleepiness is getting to a point that it is affecting his job. he would like to discuss alternative.   . Other     tried and failed, adderall IR, sunosi, nuvigil (suicidal thoughts), vyvanse. unable to start Xyrem d/t suididal thoughts history. cardiologist recommended the patient stay off stimulants.     HPI:  Dustin Pace is a 19 y.o. male , seen here as in a referral  from [facility]  to be evaluated for narcolepsy.   Dustin Pace is seen here today in the presence of his mother, he has been referred by his counselor, and has been treated for ADHD for about a year now.  The patient also mentioned to her that he has been excessively daytime sleepy and this started during high school.  He was not and excessively sleepy toddler, elementary school student and even in middle school seems not to have had trouble with sleep attacks or sudden irresistible urges to go to sleep.  As part of symptom screening he underwent an Epworth sleepiness score examination and scored 15 points.  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   He recalls isolated events where he may have lost muscle tone in response to emotional upset, anger, startle or a deep black cough.  He does not feel that this has been impairing  his ability to expose himself in social situations etc. it is not a frequent or anticipated phenomenon for him.    In today's questionnaires he scored 48 points on the fatigue severity score which is high and the Epworth sleepiness score was once again endorsed at 15 points.  Dustin Pace also has noted that his sleepiness persists if he is on ADHD medication or not. 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 Intunef is his  most recent ADHD non stimulant medication and he remains EDS ( excessively daytime sleepy).  His counselor is concerned that he is at  risk of failing the 12th grade due to excessive absences in the morning.  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  For this reason we need now to evaluate him for the possibility of a sleep disorder. He is on Prozac- for anxiety and depression, and for suicidal thoughts. Not an easy decision-  Weaning him off could expose him to severe risks.  he started on that medication 18 month ago. All if it started around the same time.     Chief complaint according to patient : " I am sleepy- sleepy"  Sleep habits are as follows: Midnight is bedtime , no trouble to fall asleep.  He sleeps alone in his room,with his dog, on two pillows and  in any sleep position.  He has dreams of being robbed , being chased, and very lucid and very real. Not every night. Does not report consecutive sleep.  Bedroom is cool , quiet and dark.  No nocturia. He wakes up frequently , is not sure if dream related. He has to rise at 9 AM - but stays until 9.20 AM and has difficulties to get up. Often late to school.  Wakes non refreshed, sleepy, dry mouth, almost daily headaches. Not nausea - no vision changes, no photophobia.   Sleep medical history and family sleep history:  Mother has EDS. No diagnosis yet. Tonsillectomy at age 36. Sleep walker in childhood, with night terrors, not recently.   Social history: lives with mother , and brother ( on weekends) , mother's fiancee and his daughter.  No tobacco use, no pot, No caffeine : not a caffeine drinker, he drinks sodas ( 40- 60 ounces Fr. Pepper each day monster drinks , (Taurin containing).  Review of Systems: Out of a complete 14 system review, the patient complains of only the following symptoms, and all other reviewed systems are negative. How likely are you to doze in the following situations: 0 = not likely, 1 = slight chance, 2 = moderate chance, 3 = high chance  Sitting  and Reading? 2 Watching Television? 1 Sitting inactive in a public place (theater or meeting)? 2 Lying down in the afternoon when circumstances permit? 3 Sitting and talking to someone? 2 Sitting quietly after lunch without alcohol? 1In a car, while stopped for a few minutes in traffic?1 As a passenger in a car for an hour without a break?3  Total = 18/ 24   Fatigue severity score  52 ,  Depression score - currently not in therapy which he started in since 2019- and score was transferred from NP , PCP.    09-25-2017, RV PSG without apnea  and MSLT with 2 out of 4 REM naps.  Confirmed diagnosis, narcolepsy and x cataplexy . I have the pleasure of meeting with Dustin Pace and his mother today on 25 September 2017 following up on his PSG from 08 August 2017 followed by an M SLT the very next day, the study confirmed narcolepsy and cataplexy the patient had undergone a Swiss narcolepsy scale evaluation score of -2 was reached, his Epworth sleepiness score was endorsed at 15 out of 24 possible points, he had described cataplectic events.  He is also treated for ADHD ADD.  In spite of being treated on stimulants he had not felt the benefits to his daytime sleepiness.  Fatigue severity scale today is really very high at 50 points and his Epworth Sleepiness Scale right now is 18 out of 24 points.  Since Dustin Pace is 83 he is at this age able to take Xyrem.  Given that he has already been on stimulants is out of the desired success I would either add modafinil was Xyrem at this point and we discussed the pros and cons.  The process of Xyrem all but it also addresses cataplexy and not just narcolepsy, the negative is that it has to be taken twice each night and for some patients that is a difficult compliance issue. I will start XYREM after discussion with mom and patient, who live together. Marland Kitchen   10-21-2019: Patient returned after a more than 2 year hiatuus, has weaned off all meds a year ago and is now stoo  sleeepy to not be affected at work. He never started on XYREM- and he has not followed. nuvigil made depression worse.  We will need to address the sleepiness. Epworth is 18/ 24 today . He lives with 2 room mates.     Social History   Socioeconomic History  . Marital status: Single    Spouse name: Not on file  . Number of children: Not on file  . Years of education: Not on file  . Highest education level: Not on file  Occupational History  . Not on file  Tobacco Use  . Smoking status: Passive Smoke Exposure - Never Smoker  . Smokeless tobacco: Never Used  Vaping Use  . Vaping Use: Never used  Substance and Sexual Activity  . Alcohol use: No  . Drug use: No  . Sexual activity: Never  Other Topics Concern  . Not on file  Social History Narrative  . Not on file   Social Determinants of Health   Financial Resource Strain:   . Difficulty of Paying Living Expenses:   Food Insecurity:   . Worried About Charity fundraiser in the Last Year:   . Arboriculturist in the Last Year:   Transportation Needs:   . Film/video editor (Medical):   Marland Kitchen Lack of Transportation (Non-Medical):   Physical Activity:   . Days of Exercise per Week:   . Minutes of Exercise per Session:   Stress:   . Feeling of Stress :   Social Connections:   . Frequency of Communication with Friends and Family:   . Frequency of Social Gatherings with Friends and Family:   . Attends Religious Services:   . Active Member of Clubs or Organizations:   . Attends Archivist Meetings:   Marland Kitchen Marital Status:   Intimate Partner Violence:   . Fear of Current or Ex-Partner:   . Emotionally Abused:   Marland Kitchen Physically Abused:   .  Sexually Abused:     Family History  Problem Relation Age of Onset  . Anxiety disorder Mother   . Thyroid disease Mother   . Depression Mother   . Other Maternal Grandfather     Past Medical History:  Diagnosis Date  . Anxiety   . Depression   . Headache(784.0)   .  Narcolepsy   . Pectus excavatum   . Syncope and collapse     Past Surgical History:  Procedure Laterality Date  . TONSILLECTOMY AND ADENOIDECTOMY N/A 07/26/2012   Procedure: TONSILLECTOMY AND ADENOIDECTOMY;  Surgeon: Jerrell Belfast, MD;  Location: [facility];  Service: ENT;  Laterality: N/A;    No current outpatient medications on file.   No current facility-administered medications for this visit.    Allergies as of 10/21/2019 - Review Complete 10/21/2019  Allergen Reaction Noted  . Nuvigil [armodafinil] Other (See Comments) 11/07/2017  . Other  01/22/2019    Vitals: BP 114/61   Pulse (!) 56   Ht 5' 10.5" (1.791 m)   Wt 189 lb (85.7 kg)   BMI 26.74 kg/m  Last Weight:  Wt Readings from Last 1 Encounters:  10/21/19 189 lb (85.7 kg) (87 %, Z= 1.11)*   * Growth percentiles are based on CDC (Boys, 2-20 Years) data.   JJO:ACZY mass index is 26.74 kg/m.     Last Height:   Ht Readings from Last 1 Encounters:  10/21/19 5' 10.5" (1.791 m) (62 %, Z= 0.31)*   * Growth percentiles are based on CDC (Boys, 2-20 Years) data.    Physical exam:  General: The patient is awake, alert and appears not in acute distress. The patient is well groomed. Head: Normocephalic, atraumatic. Neck is supple. Mallampati 1- open wide ,  neck circumference: 16. 25  Nasal airflow patent ,  Retrognathia is seen.  Cardiovascular:  Regular rate and rhythm ,  without distended neck veins. Respiratory: Lungs are clear to auscultation. Skin:  Without evidence of edema, or rash Trunk: BMI is 26. 3 The patient's posture is erect,    Neurologic exam : The patient is awake and alert, oriented to place and time.    Attention span & concentration ability appears normal  Speech is fluent, without  dysarthria, dysphonia or aphasia.  Mood and affect are appropriate.  Cranial nerves: Pupils are equally dilated - very large - and briskly reactive to light. Funduscopic exam without evidence of  pallor or edema. Extraocular movements  in vertical and horizontal planes intact and without nystagmus. Visual fields by finger perimetry are intact. . Facial sensation intac.  Facial motor strength is symmetric and tongue , uvula move midline.  Shoulder shrug was symmetrical.   Motor exam:   Normal tone, muscle bulk and symmetric strength in all extremities. Sensory:  Fine touch, pinprick and vibration were tested in all extremities.Coordination: Rapid alternating movements in the fingers/hands was normal. Finger-to-nose maneuver  normal without evidence of ataxia, dysmetria or tremor. Gait and station: Patient walks without assistive device and is able unassisted to climb up to the exam table. Strength within normal limits. Stance is stable and normal. Deep tendon reflexes: in the  upper and lower extremities are symmetric.   Assessment:  After physical and neurologic examination, review of laboratory studies,  Personal review of imaging studies, reports of other /same  Imaging studies, results of polysomnography and / or neurophysiology testing and pre-existing records as far as provided in visit., my assessment is   1)Narcolepsy with cataplexy.  Epworth 18-24 points on7-09-2019, high level  indeed, excessive daytime sleepiness.  Unsafe to drive by his own assessment. He dozes off while not physically active and/ or not mentally stimulated.  Trouble to rise in AM, delayed bedtime.  Had positive  HLA test, not a great response of  modafinil. Weaning off Prozac was coordinated with his counselor for the positive MSLT.MSLT confirmed  Narcolepsy- with cataplexy now.    ADHD not treated to adderall, ritalin. Start XYREM.   The patient was advised of the nature of the diagnosed disorder , the treatment options and the  risks for general health and wellness arising from not treating the condition.   I spent more than 20 minutes of face to face time with the patient.  Greater than 50% of time was  spent in counseling and coordination of care. We have discussed the diagnosis and differential and I answered the patient's questions.    Plan:  Treatment plan and additional workup :   XYREM titration- REMS program .   Depression screening with all future visits.   As discussed, Xyrem has to be taken with very mindful caution: Taking Xyrem correctly is key. This means, take it only when you are fully ready to fall asleep, while in bed and refrain from doing any other activities, even brushing  your teeth after taking your first dose. The second dose will be about 2-1/2-4 hours after his first dose. You can go to the bathroom before your 2nd dose. Take your first dose, when actually IN BED, ready to sleep. No sitting up in bed, NO reading, NO using the cell phone or computer, NO getting up to use the bathroom. Take care of everything BEFORE sleep time. Try NOT to skip the second dose as the Xyrem is not going to stay in your system long enough with only one dose. Do not drink alcohol with Xyrem. If you do drink Alcohol, you cannot take your Xyrem doses that night.      Larey Seat, MD 07/19/2759, 4:70 AM  Certified in Neurology by ABPN Certified in Sleep Medicine by [facility] [address], [place],  [address]          Addendum_        IMPRESSION:  1. This multiple sleep latency test reveals a mean sleep latency of 4.5 minutes with 2 sleep periods during which REM sleep was recorded.   2. A total of 4 nap opportunities were given and 3 sleep periods were recorded.   3. This study was preceded by an overnight polysomnogram with a total sleep time (TST) of 381 minutes.       Name:  Levy, Cedano Reference #:  [id number]  Study Date: 08/09/2017 DOB: 05-27-2000      I attest to having reviewed every epoch of the entire raw data recording prior to the issuance of this report in accordance with the Standards of the [place] [employer]  [employer].     RECOMMENDATIONS:  This MSLT study is abnormal due to short mean sleep latency, confirming hypersomnolence, and due to 2 onsets of REM sleep in 4 naps.  This study, given the proper clinical scenario, is consistent with a diagnosis of narcolepsy.  The patient should be treated for narcolepsy.     Larey Seat, M.D.  08-23-2017   Diplomat, American Board of Psychiatry and Neurology  Diplomat, [place] of Sleep Medicine Medical Director, [place] [facility]

## 2019-10-22 LAB — COMPREHENSIVE METABOLIC PANEL
ALT: 22 IU/L (ref 0–44)
AST: 20 IU/L (ref 0–40)
Albumin/Globulin Ratio: 2.3 — ABNORMAL HIGH (ref 1.2–2.2)
Albumin: 5.1 g/dL (ref 4.1–5.2)
Alkaline Phosphatase: 75 IU/L (ref 55–125)
BUN/Creatinine Ratio: 12 (ref 9–20)
BUN: 12 mg/dL (ref 6–20)
Bilirubin Total: 0.6 mg/dL (ref 0.0–1.2)
CO2: 27 mmol/L (ref 20–29)
Calcium: 9.8 mg/dL (ref 8.7–10.2)
Chloride: 99 mmol/L (ref 96–106)
Creatinine, Ser: 1 mg/dL (ref 0.76–1.27)
GFR calc Af Amer: 126 mL/min/{1.73_m2} (ref >59)
GFR calc non Af Amer: 109 mL/min/{1.73_m2} (ref >59)
Globulin, Total: 2.2 g/dL (ref 1.5–4.5)
Glucose: 95 mg/dL (ref 65–99)
Potassium: 4.5 mmol/L (ref 3.5–5.2)
Sodium: 139 mmol/L (ref 134–144)
Total Protein: 7.3 g/dL (ref 6.0–8.5)

## 2019-10-22 NOTE — Progress Notes (Signed)
Normal CMET- can start [facility]

## 2019-11-03 NOTE — Telephone Encounter (Signed)
Received a fax stating that this medication has been approved and is on file.

## 2019-11-03 NOTE — Telephone Encounter (Signed)
Received a new PA request to be completed. I have resubmitted the PA on cover my meds/ [employer]. [id number] Will wait up to 5 days before hearing back. Sent as urgent. Sent office notes and SS reports as well.

## 2019-11-06 NOTE — Telephone Encounter (Signed)
Pharmacist [email or web address]  pharmacy has called to report that he called pt and discussed pt's depression(which had not been documented) pt was asked about suicidal thoughts pt said both are better due to therapy and his suicidal thoughts are infrequent.  Pt stated he will notify his therapist if there were a change or reoccurrence in his depression or suicidal .  Katherina Right also said pt has not had any depression since being out of his parents home for 2 years now.  Katherina Right said pt was informed that this will be under review with Dr Vickey Huger and from there a decision will be made.  Katherina Right states a pharmacist can be reached at [phone] option 3 then option 4

## 2019-11-06 NOTE — Telephone Encounter (Signed)
Noted we are aware of this. Documented in the patient's chart. We educated on this as well at his last office visit.

## 2019-11-10 NOTE — Telephone Encounter (Signed)
Just an FYI. Pharmacist Clifton Custard called from [facility] stating that they are going to ship the medication to the patient and want the Doctor to be aware of this.

## 2019-11-10 NOTE — Telephone Encounter (Signed)
Noted we appreciate that

## 2020-01-14 ENCOUNTER — Other Ambulatory Visit: Payer: Self-pay | Admitting: Neurology

## 2020-01-14 MED ORDER — XYREM 500 MG/ML PO SOLN: ORAL | 0 refills | Status: DC

## 2020-02-04 ENCOUNTER — Ambulatory Visit: Payer: BC Managed Care – PPO | Admitting: Adult Health

## 2020-02-04 ENCOUNTER — Encounter: Payer: Self-pay | Admitting: Adult Health

## 2020-02-04 ENCOUNTER — Other Ambulatory Visit: Payer: Self-pay

## 2020-02-04 VITALS — BP 120/68 | Ht 70.5 in | Wt 172.0 lb

## 2020-02-04 DIAGNOSIS — G47411 Narcolepsy with cataplexy: Secondary | ICD-10-CM

## 2020-02-04 NOTE — Progress Notes (Signed)
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  He returns today for follow-up.  He is currently taking Xyrem and tolerating it well.  He states that he does not fall asleep while at work or at school.  Denies any trouble driving.  Denies depression or thoughts of suicide.  Patient mentions that last week he had a headache behind his left eye that radiated to the back of the head.  He states that he will get flashes in his vision.  He had this several times last week.  Does not have a diagnosis of migraine headaches.  Reports that this week he has not had any issues.  I advised the patient that if he develops the symptoms again he may need a formal work-up for migraine headaches.  He returns today for an evaluation.  HISTORY (Copied from Dr.Dohmeier's note) Dustin Pace is a 19 y.o. male , seen here as in a referral  from Counselor Gibbons  to be evaluated for narcolepsy.   Dustin Pace is seen here today in the presence of his mother, he has been referred by his counselor, and has been treated for ADHD for about a year now.  The patient also mentioned to her that he has been excessively daytime sleepy and this started during high school.  He was not and excessively sleepy toddler, elementary school student and even in middle school seems not to have had trouble with sleep attacks or sudden irresistible urges to go to sleep.  As part of symptom screening he underwent an Epworth sleepiness score examination and scored 15 points.  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   He recalls isolated events where he may have lost muscle tone in response to emotional upset, anger, startle or a deep black cough.  He does not feel that this has been impairing his  ability to expose himself in social situations etc. it is not a frequent or anticipated phenomenon for him.    In today's questionnaires he scored 48 points on the fatigue severity score which is high and the Epworth sleepiness score was once again endorsed at 15 points.  Dustin Pace also has noted that his sleepiness persists if he is on ADHD medication or not. 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 Intunef is his  most recent ADHD non stimulant medication and he remains EDS ( excessively daytime sleepy).  His counselor is concerned that he is at risk of failing the 12th grade due to excessive absences in the morning.  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  For this reason we need now to evaluate him for the possibility of a sleep disorder. He is on Prozac- for anxiety and depression, and for suicidal thoughts. Not an easy decision-  Weaning him off could expose him to severe risks.  he started on that medication 18 month ago. All if it started around the same time.     Chief complaint according to patient : "  I am sleepy- sleepy"  Sleep habits are as follows: Midnight is bedtime , no trouble to fall asleep.  He sleeps alone in his room,with his dog, on two pillows and  in any sleep position.  He has dreams of being robbed , being chased, and very lucid and very real. Not every night. Does not report consecutive sleep.  Bedroom is cool , quiet and dark.  No nocturia. He wakes up frequently , is not sure if dream related. He has to rise at 9 AM - but stays until 9.20 AM and has difficulties to get up. Often late to school.  Wakes non refreshed, sleepy, dry mouth, almost daily headaches. Not nausea - no vision  changes, no photophobia.   Sleep medical history and family sleep history:  Mother has EDS. No diagnosis yet. Tonsillectomy at age 213. Sleep walker in childhood, with night terrors, not recently.   Social history: lives with mother , and brother ( on weekends) , mother's fiancee and his daughter.  No tobacco use, no pot, No caffeine : not a caffeine drinker, he drinks sodas ( 40- 60 ounces Fr. Pepper each day monster drinks , (Taurin containing).  REVIEW OF SYSTEMS: Out of a complete 14 system review of symptoms, the patient complains only of the following symptoms, and all other reviewed systems are negative.  See HPI  ALLERGIES: Allergies  Allergen Reactions  . Nuvigil [Armodafinil] Other (See Comments)    Suicidal ideations  . Other     Stimulants  - increase HR and BP     HOME MEDICATIONS: Outpatient Medications Prior to Visit  Medication Sig Dispense Refill  . Sodium Oxybate (XYREM) 500 MG/ML SOLN 4.5 G twice nightly 540 mL 0   No facility-administered medications prior to visit.    PAST MEDICAL HISTORY: Past Medical History:  Diagnosis Date  . Anxiety   . Depression   . Headache(784.0)   . Narcolepsy   . Pectus excavatum   . Syncope and collapse     PAST SURGICAL HISTORY: Past Surgical History:  Procedure Laterality Date  . TONSILLECTOMY AND ADENOIDECTOMY N/A 07/26/2012   Procedure: TONSILLECTOMY AND ADENOIDECTOMY;  Surgeon: Osborn Cohoavid Shoemaker, MD;  Location: [facility];  Service: ENT;  Laterality: N/A;    FAMILY HISTORY: Family History  Problem Relation Age of Onset  . Anxiety disorder Mother   . Thyroid disease Mother   . Depression Mother   . Other Maternal Grandfather     SOCIAL HISTORY: Social History   Socioeconomic History  . Marital status: Single    Spouse name: Not on file  . Number of children: Not on file  . Years of education: Not on file  . Highest education level: Not on file  Occupational History  . Not on file    Tobacco Use  . Smoking status: Passive Smoke Exposure - Never Smoker  . Smokeless tobacco: Never Used  Vaping Use  . Vaping Use: Never used  Substance and Sexual Activity  . Alcohol use: No  . Drug use: No  . Sexual activity: Never  Other Topics Concern  . Not on file  Social History Narrative  . Not on file   Social Determinants of Health   Financial Resource Strain:   . Difficulty of Paying Living Expenses: Not on file  Food Insecurity:   . Worried About Programme researcher, broadcasting/film/videounning Out of Food in the Last Year: Not on file  . Ran Out of Food in the Last Year: Not on file  Transportation Needs:   . Freight forwarder (Medical): Not on file  . Lack of Transportation (Non-Medical): Not on file  Physical Activity:   . Days of Exercise per Week: Not on file  . Minutes of Exercise per Session: Not on file  Stress:   . Feeling of Stress : Not on file  Social Connections:   . Frequency of Communication with Friends and Family: Not on file  . Frequency of Social Gatherings with Friends and Family: Not on file  . Attends Religious Services: Not on file  . Active Member of Clubs or Organizations: Not on file  . Attends Banker Meetings: Not on file  . Marital Status: Not on file  Intimate Partner Violence:   . Fear of Current or Ex-Partner: Not on file  . Emotionally Abused: Not on file  . Physically Abused: Not on file  . Sexually Abused: Not on file      PHYSICAL EXAM  Vitals:   02/04/20 0829  BP: 120/68  Weight: 172 lb (78 kg)  Height: 5' 10.5" (1.791 m)   Body mass index is 24.33 kg/m.  Generalized: Well developed, in no acute distress   Neurological examination  Mentation: Alert oriented to time, place, history taking. Follows all commands speech and language fluent Cranial nerve II-XII: Pupils were equal round reactive to light. Extraocular movements were full, visual field were full on confrontational test.  Head turning and shoulder shrug  were normal and  symmetric. Motor: The motor testing reveals 5 over 5 strength of all 4 extremities. Good symmetric motor tone is noted throughout.  Sensory: Sensory testing is intact to soft touch on all 4 extremities. No evidence of extinction is noted.  Coordination: Cerebellar testing reveals good finger-nose-finger and heel-to-shin bilaterally.  Gait and station: Gait is normal.  Reflexes: Deep tendon reflexes are symmetric and normal bilaterally.   DIAGNOSTIC DATA (LABS, IMAGING, TESTING) - I reviewed patient records, labs, notes, testing and imaging myself where available.  Lab Results  Component Value Date   WBC 6.4 11/17/2016   HGB 13.2 11/17/2016   HCT 37.2 11/17/2016   MCV 87.7 11/17/2016   PLT 192 11/17/2016      Component Value Date/Time   NA 139 10/21/2019 0913   K 4.5 10/21/2019 0913   CL 99 10/21/2019 0913   CO2 27 10/21/2019 0913   GLUCOSE 95 10/21/2019 0913   GLUCOSE 84 11/17/2016 2003   BUN 12 10/21/2019 0913   CREATININE 1.00 10/21/2019 0913   CALCIUM 9.8 10/21/2019 0913   PROT 7.3 10/21/2019 0913   ALBUMIN 5.1 10/21/2019 0913   AST 20 10/21/2019 0913   ALT 22 10/21/2019 0913   ALKPHOS 75 10/21/2019 0913   BILITOT 0.6 10/21/2019 0913   GFRNONAA 109 10/21/2019 0913   GFRAA 126 10/21/2019 0913   No results found for: CHOL, HDL, LDLCALC, LDLDIRECT, TRIG, CHOLHDL No results found for: KWIO9B No results found for: VITAMINB12 Lab Results  Component Value Date   TSH 0.955 01/22/2019      ASSESSMENT AND PLAN 19 y.o. year old male  has a past medical history of Anxiety, Depression, Headache(784.0), Narcolepsy, Pectus excavatum, and Syncope and collapse. here with :  1.  Narcolepsy  -Continue Xyrem -Blood work in July was unremarkable -Advised if symptoms worsen or he develops new symptoms he should let us know -Follow-up in 6 months or sooner if needed   I spent 25 minutes of face-to-face and non-face-to-face time with patient.  This included  previsit chart  review, lab review, study review, order entry, electronic health record documentation, patient education.  Butch Penny, MSN, NP-C 02/04/2020, 9:00 AM [facility] [address], [address] [place], [place] [address] [phone]

## 2020-02-04 NOTE — Patient Instructions (Signed)
Your Plan:  Continue xyrem     Thank you for coming to see [place] at [facility]. I hope we have been able to provide you high quality care today.  You may receive a patient satisfaction survey over the next few weeks. We would appreciate your feedback and comments so that we may continue to improve ourselves and the health of our patients.

## 2020-02-11 ENCOUNTER — Telehealth: Payer: Self-pay | Admitting: Neurology

## 2020-02-11 NOTE — Telephone Encounter (Signed)
Received a PA request for Xyrem. PA was started via [email or web address]. Key is [id number]. Per CMM, a determination will be made within the next 72 hours.   Will check back later for a determination.

## 2020-02-18 NOTE — Telephone Encounter (Signed)
Received notice from [facility] that PA has been approved 02/11/20 to 02/09/21. Will fax a copy of the determination to patient's pharmacy once it has been received.

## 2020-02-24 ENCOUNTER — Telehealth: Payer: Self-pay

## 2020-02-24 NOTE — Telephone Encounter (Signed)
[facility] pharmacy is asking for a new prescription on pt's Xyrem, if there are questions a call back #  [phone] option 3 then option 4 for a pharmacist

## 2020-02-24 NOTE — Telephone Encounter (Signed)
Received a message from [facility] asking for a refill on patient's Xyrem. Pt was last seen on 02/04/20 by [facility]. Next OV is scheduled for 08/05/19 with Megan. Last RX refill was on 01/14/20 for .   Megan, please advise if you are ok with this refill.

## 2020-02-25 ENCOUNTER — Other Ambulatory Visit: Payer: Self-pay | Admitting: Neurology

## 2020-02-25 MED ORDER — XYREM 500 MG/ML PO SOLN: ORAL | 0 refills | Status: DC

## 2020-02-25 NOTE — Telephone Encounter (Signed)
Refill has been sent this morning for the patient

## 2020-02-25 NOTE — Telephone Encounter (Signed)
Can you have Dr. Vickey Huger refill this

## 2020-04-29 ENCOUNTER — Telehealth: Payer: Self-pay | Admitting: Adult Health

## 2020-04-29 DIAGNOSIS — R55 Syncope and collapse: Secondary | ICD-10-CM | POA: Diagnosis not present

## 2020-04-29 NOTE — Telephone Encounter (Signed)
states there he was advised to f/u with his Neurologist as this may be something else not related to his narcolepsy.  Please call pt to discuss

## 2020-04-29 NOTE — Telephone Encounter (Signed)
Spoke to pt and he is trying to get appt with Dr. Vickey Huger for syncope,  Had passing out episode this week,  Seen pcp( he could not tell me where) not sure what caused.  Referred to neuro and cardiology.  I relayed that since new problem will need to see MD for evaluation.  He will get pcp to send records.  I will let referral know as well.

## 2020-05-21 ENCOUNTER — Encounter: Payer: Self-pay | Admitting: Cardiology

## 2020-05-21 ENCOUNTER — Ambulatory Visit: Payer: BC Managed Care – PPO | Admitting: Cardiology

## 2020-05-21 ENCOUNTER — Other Ambulatory Visit: Payer: Self-pay

## 2020-05-21 VITALS — BP 114/69 | HR 74 | Temp 98.1°F | Resp 17 | Ht 70.0 in | Wt 174.6 lb

## 2020-05-21 DIAGNOSIS — R55 Syncope and collapse: Secondary | ICD-10-CM | POA: Diagnosis not present

## 2020-05-21 NOTE — Progress Notes (Signed)
Patient referred by Everardo Beals, NP for syncope  Subjective:   Dustin Pace, male    DOB: 2001-04-03, 20 y.o.   MRN: [id number]   Chief Complaint  Patient presents with  . Loss of Consciousness  . New Patient (Initial Visit)    Ref by Everardo Beals     HPI  20 y.o. Caucasian male with depression, anxiety, narcolepsy, referred for evaluation of syncope.  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  He has had at least 4 syncope episodes over the years.  Most recent episode occurred about a month ago.  He was playing video games at 3 AM Saturday, which is usual practice for him.  He had been sitting at his desk and looking at computer screen for several hours.  He suddenly had a sensation of "stomach turning", and nausea.  He tried to reach for water on his desk, followed by flush sensation and then loss of consciousness.  He found himself on the floor.  He had likely bitten his tongue, without any bleeding.  There was no loss of bowel or bladder tone.  He reckons he would have been on for about 30 seconds.  He has had couple other episodes of syncope in the past.  1 episode occurred while coming out of a hot shower.  Further episode occurred during a blood draw.  1 other episode occurred after having intense retrosternal pain while eating steak at a restaurant.  He was noted to have shaking episodes after his syncope at the restaurant.  Patient previously seen a pediatric cardiologist at [facility] in 2018 for murmur.  Echocardiogram then had showed mild aortic regurgitation.  On a separate note, patient is being treated by Dr. Asencion Partridge Dohmeier for narcolepsy.  He has not had any episodes of syncope, or narcolepsy, during driving.  At baseline, he walks at work, plays vascular at least once a week.  He denies chest pain, shortness of breath, palpitations, leg edema, orthopnea, PND, TIA.  Past Medical  History:  Diagnosis Date  . Anxiety   . Depression   . Headache(784.0)   . Narcolepsy   . Pectus excavatum   . Syncope and collapse      Past Surgical History:  Procedure Laterality Date  . TONSILLECTOMY AND ADENOIDECTOMY N/A 07/26/2012   Procedure: TONSILLECTOMY AND ADENOIDECTOMY;  Surgeon: Jerrell Belfast, MD;  Location: [place];  Service: ENT;  Laterality: N/A;     Social History   Tobacco Use  Smoking Status Passive Smoke Exposure - Never Smoker  Smokeless Tobacco Never Used    Social History   Substance and Sexual Activity  Alcohol Use No     Family History  Problem Relation Age of Onset  . Anxiety disorder Mother   . Thyroid disease Mother   . Depression Mother   . Other Maternal Grandfather      Current Outpatient Medications on File Prior to Visit  Medication Sig Dispense Refill  . Sodium Oxybate (XYREM) 500 MG/ML SOLN 4.5 G twice nightly 540 mL 0   No current facility-administered medications on file prior to visit.    Cardiovascular and other pertinent studies:  EKG 05/21/2020: Sinus rhythm 57 bpm with sinus arrhtymia Incomplete right bundle branch block  Echocardiogram 12/2016: Trivial mitral regurgitation Normal biventricular size and systolic function Trileaflet aortic valve with mild aortic regurgitation, pressure half time  ~ 482- 563 ms Normal aortic root  and ascending aorta No pericardial effusion Compared to the previous study of 05/02/16, the aortic regurgitation  subjectively appears slightly worse, but is still mild.   Recent labs: 04/29/2020: Glucose 75, BUN/Cr 9/0.97. EGFR normal. Na/K 142/4.5. Rest of the CMP normal H/H 15/44. MCV 92. Platelets 187  10/21/2019: Glucose 95, BUN/Cr 12/1.0. EGFR normal. Na/K 139/4.5. Rest of the CMP normal  01/2019: TSH normal   Review of Systems  Cardiovascular: Positive for syncope. Negative for chest pain, dyspnea on exertion, leg swelling and palpitations.          Vitals:   05/21/20 1318 05/21/20 1320  BP: 109/62 114/69  Pulse: 79 74  Resp:    Temp:    SpO2: 99% 99%     Body mass index is 24.68 kg/m. Filed Weights   05/21/20 1301  Weight: 174 lb 9.6 oz (79.2 kg)     Orthostatic vitals: Supine: 120/58 mmHg, HR 61/min Sitting: 109/62 mmHg, HR 79/min standing: 114/69 mmHg, HR 74/min    Objective:   Physical Exam Vitals and nursing note reviewed.  Constitutional:      General: He is not in acute distress. Neck:     Vascular: No JVD.  Cardiovascular:     Rate and Rhythm: Normal rate and regular rhythm.     Heart sounds: Normal heart sounds. No murmur heard.   Pulmonary:     Effort: Pulmonary effort is normal.     Breath sounds: Normal breath sounds. No wheezing or rales.          Assessment & Recommendations:   20 y.o. Caucasian male with depression, anxiety, narcolepsy, referred for evaluation of syncope.  By history, most likely appears to be vasovagal syncope.  Discussed counterpressure maneuvers with the patient.  Encourage liberal hydration, consider use of compression stockings.  My suspicion for seizures is low, however defer evaluation to neurology as necessary.  I will obtain echocardiogram.    Further recommendations after above testing.    Thank you for referring the patient to [place]. Please feel free to contact with any questions.   Nigel Mormon, MD Pager: [phone] Office: [phone]

## 2020-06-19 DIAGNOSIS — Z03818 Encounter for observation for suspected exposure to other biological agents ruled out: Secondary | ICD-10-CM | POA: Diagnosis not present

## 2020-06-19 DIAGNOSIS — Z20822 Contact with and (suspected) exposure to covid-19: Secondary | ICD-10-CM | POA: Diagnosis not present

## 2020-07-01 ENCOUNTER — Ambulatory Visit: Payer: BC Managed Care – PPO

## 2020-07-01 ENCOUNTER — Other Ambulatory Visit: Payer: Self-pay

## 2020-07-01 DIAGNOSIS — R55 Syncope and collapse: Secondary | ICD-10-CM

## 2020-07-22 ENCOUNTER — Ambulatory Visit: Payer: BC Managed Care – PPO | Admitting: Neurology

## 2020-07-22 ENCOUNTER — Encounter: Payer: Self-pay | Admitting: Neurology

## 2020-07-22 VITALS — BP 117/51 | HR 66 | Ht 70.5 in | Wt 172.0 lb

## 2020-07-22 DIAGNOSIS — H539 Unspecified visual disturbance: Secondary | ICD-10-CM

## 2020-07-22 DIAGNOSIS — S01552A Open bite of oral cavity, initial encounter: Secondary | ICD-10-CM

## 2020-07-22 DIAGNOSIS — G47411 Narcolepsy with cataplexy: Secondary | ICD-10-CM | POA: Diagnosis not present

## 2020-07-22 DIAGNOSIS — R55 Syncope and collapse: Secondary | ICD-10-CM

## 2020-07-22 DIAGNOSIS — G478 Other sleep disorders: Secondary | ICD-10-CM

## 2020-07-22 MED ORDER — XYREM 500 MG/ML PO SOLN: ORAL | 0 refills | Status: DC

## 2020-07-22 NOTE — Addendum Note (Signed)
Addended by: Melvyn Novas on: 07/22/2020 02:59 PM   Modules accepted: Orders

## 2020-07-22 NOTE — Patient Instructions (Signed)
Seizure, Adult A seizure is a sudden burst of abnormal electrical and chemical activity in the brain. Seizures usually last from 30 seconds to 2 minutes.  What are the causes? Common causes of this condition include:  Fever or infection.  Problems that affect the brain. These may include: ? A brain or head injury. ? Bleeding in the brain. ? A brain tumor.  Low levels of blood sugar or salt.  Kidney problems or liver problems.  Conditions that are passed from parent to child (are inherited).  Problems with a substance, such as: ? Having a reaction to a drug or a medicine. ? Stopping the use of a substance all of a sudden (withdrawal).  A stroke.  Disorders that affect how you develop. Sometimes, the cause may not be known.  What increases the risk?  Having someone in your family who has epilepsy. In this condition, seizures happen again and again over time. They have no clear cause.  Having had a tonic-clonic seizure before. This type of seizure causes you to: ? Tighten the muscles of the whole body. ? Lose consciousness.  Having had a head injury or strokes before.  Having had a lack of oxygen at birth. What are the signs or symptoms? There are many types of seizures. The symptoms vary depending on the type of seizure you have. Symptoms during a seizure  Shaking that you cannot control (convulsions) with fast, jerky movements of muscles.  Stiffness of the body.  Breathing problems.  Feeling mixed up (confused).  Staring or not responding to sound or touch.  Head nodding.  Eyes that blink, flutter, or move fast.  Drooling, grunting, or making clicking sounds with your mouth  Losing control of when you pee or poop. Symptoms before a seizure  Feeling afraid, nervous, or worried.  Feeling like you may vomit.  Feeling like: ? You are moving when you are not. ? Things around you are moving when they are not.  Feeling like you saw or heard something before  (dj vu).  Odd tastes or smells.  Changes in how you see. You may see flashing lights or spots. Symptoms after a seizure  Feeling confused.  Feeling sleepy.  Headache.  Sore muscles. How is this treated? If your seizure stops on its own, you will not need treatment. If your seizure lasts longer than 5 minutes, you will normally need treatment. Treatment may include:  Medicines given through an IV tube.  Avoiding things, such as medicines, that are known to cause your seizures.  Medicines to prevent seizures.  A device to prevent or control seizures.  Surgery.  A diet low in carbohydrates and high in fat (ketogenic diet). Follow these instructions at home: Medicines  Take over-the-counter and prescription medicines only as told by your doctor.  Avoid foods or drinks that may keep your medicine from working, such as alcohol. Activity  Follow instructions about driving, swimming, or doing things that would be dangerous if you had another seizure. Wait until your doctor says it is safe for you to do these things.  If you live in the [place] ask your local department of motor vehicles when you can drive.  Get a lot of rest. Teaching others  Teach friends and family what to do when you have a seizure. They should: ? Help you get down to the ground. ? Protect your head and body. ? Loosen any clothing around your neck. ? Turn you on your side. ? Know whether or not   you need emergency care. ? Stay with you until you are better.  Also, tell them what not to do if you have a seizure. Tell them: ? They should not hold you down. ? They should not put anything in your mouth.   General instructions  Avoid anything that gives you seizures.  Keep a seizure diary. Write down: ? What you remember about each seizure. ? What you think caused each seizure.  Keep all follow-up visits. Contact a doctor if:  You have another seizure or seizures. Call the doctor each time you  have a seizure.  The pattern of your seizures changes.  You keep having seizures with treatment.  You have symptoms of being sick or having an infection.  You are not able to take your medicine. Get help right away if:  You have any of these problems: ? A seizure that lasts longer than 5 minutes. ? Many seizures in a row and you do not feel better between seizures. ? A seizure that makes it harder to breathe. ? A seizure and you can no longer speak or use part of your body.  You do not wake up right after a seizure.  You get hurt during a seizure.  You feel confused or have pain right after a seizure. These symptoms may be an emergency. Get help right away. Call your local emergency services (911 in the [place]  Do not wait to see if the symptoms will go away.  Do not drive yourself to the hospital. Summary  A seizure is a sudden burst of abnormal electrical and chemical activity in the brain. Seizures normally last from 30 seconds to 2 minutes.  Causes of seizures include illness, injury to the head, low levels of blood sugar or salt, and certain conditions.  Most seizures will stop on their own in less than 5 minutes. Seizures that last longer than 5 minutes are a medical emergency and need treatment right away.  Many medicines are used to treat seizures. Take over-the-counter and prescription medicines only as told by your doctor. This information is not intended to replace advice given to you by your health care provider. Make sure you discuss any questions you have with your health care provider. Document Revised: 10/10/2019 Document Reviewed: 10/10/2019 Elsevier Patient Education  2021 [employer]  

## 2020-07-22 NOTE — Progress Notes (Signed)
Name:  Lloyd, Ayo Reference [id number]  Study Date: 08/09/2017 Procedure #: 1984  DOB: 2000/04/22    Protocol This is a 13 channel Multiple Sleep Latency Test comprised of 5 channels of EEG (T3-Cz, Cz-T4, F4-M1, C4-M1, O2-M1), 3 channels of Chin EMG, 4 channels of EOG and 1 channel for ECG.   All channels were sampled at 256 hz.    This polysomnographic procedure is designed to evaluate (1) the complaint of excessive daytime sleepiness by quantifying the time required to fall asleep and (2) the possibility of narcolepsy by checking for abnormally short latencies to REM sleep.  Electrographic variables include EEG, EMG, EOG and ECG.  Patients are monitored throughout four or five 20-minute opportunities to sleep (naps) at two-hour intervals.  For each nap, the patient is allowed 20 minutes to fall asleep.  Once asleep, the patient is awakened after 15 minutes.  Between naps, the patient is kept as alert as possible.  A sleep latency of 20 minutes indicates that no sleep occurred.  Parametric Analysis  Total Number of Naps 4     NAP # Time of Nap  Sleep Latency (mins) REM Latency (mins) Sleep Time Percent Awake Time Percent  1 08:57 4.0 14.0    2 08:58 3.5 0  ![place]   3 10:55 9.0 0     4 12:57 3.0 15.0    5 Not needed   MSLT Summary of Naps  Mean Sleep Latency to First Four Naps: 4.8    Results from Preceding PSG Study  Sleep Onset Time 22:44 Sleep Efficiency (%) 79.2  Rise Time 05:18 Sleep Latency (min) 87.5  Total Sleep Time  381 REM Latency (min) 161.5        [facility]IC   Provider:  Larey Seat, M D  Primary Care Physician:  Patient, No Pcp Per (Inactive)   Referring Provider: Dr Einar Gip , MD Cardiology   Chief Complaint  Patient presents with  . Neurologic Problem    Pt alone, rm 10. Presents today with new concern. Earlier this year he was sitting at his desk at home and became light headed/nauseous.  His face became flushed and had a  hot/cold sensation come over him.  He stated he took a sip of water and then the next thing he knew he was waking up for.  Only a minute had went by.  There have been a couple of other times over the years that he has had a passing out spell all from different reasons nothing that he was able to associate it with.   . Other    Pt followed with cardiology and they completed echo. He states that with the xyrem at 4.5 G twice nightly he would get nauseated - so he backed the dose back down to 3 g twice nightly and is tolerating at that dose.     HPI:  KYLOR VALVERDE is a 20 y.o. male , seen here as in a referral  from Dr Einar Gip about a new concern, a LOC/ Spell.  Mr. Marchuk has been followed here for narcolepsy which we had diagnosed him with in 2021 and she has been doing well with his medication his excessive daytime sleepiness has been controlled.  He presents now with a new concern he was reporting a loss of consciousness or loss of awareness I described in detail it occurred earlier this year he was sitting at his desk at home when he felt nauseated and lightheaded  his face became flushed and he had this feeling of hot and cold sensations coming over him. He grabbed the water bottle on the desk and took a sip- swallowed without difficulty-  And then must have lost awareness, LOC. He came to about 1 minute later , on the floor, the water-bottle crushed in his left hand , water spilled, and he felt himself biting his tongue- he needed a moment to fully become aware - felt like a hypnapompic sleep hallucination.   No incontinence , tongue bite, head had crushed a carton box on the floor next to his right edge of the table.   He had 2 previous episodes of syncope or be interpreted the spells of syncope at the time and twice was he found shaking as he had passed out, but there was no true convulsive injury or confusion after he came to.  He has narcolepsy with cataplexy.     SLEEP CONSULTATION  requested by Counselor Donney Rankins  to be evaluated for narcolepsy. 07-05-2017  Mr. Yutzy is seen here today in the presence of his mother, he has been referred by his counselor, and has been treated for ADHD for about a year now.  The patient also mentioned to her that he has been excessively daytime sleepy and this started during high school.  He was not and excessively sleepy toddler, elementary school student and even in middle school seems not to have had trouble with sleep attacks or sudden irresistible urges to go to sleep.  As part of symptom screening he underwent an Epworth sleepiness score examination and scored 15 points.  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   He recalls isolated events where he may have lost muscle tone in response to emotional upset, anger, startle or a deep black cough.  He does not feel that this has been impairing his ability to expose himself in social situations etc. it is not a frequent or anticipated phenomenon for him.    In today's questionnaires he scored 48 points on the fatigue severity score which is high and the Epworth sleepiness score was once again endorsed at 15 points.  Rulon also has noted that his sleepiness persists if he is on ADHD medication or not. 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 Intunef is his  most recent ADHD non stimulant medication and he remains EDS ( excessively daytime sleepy).  His counselor is concerned that he is at risk of failing the 12th grade due to excessive absences in the morning.  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  For this reason we need now to evaluate  him for the possibility of a sleep disorder. He is on Prozac- for anxiety and depression, and for suicidal thoughts. Not an easy decision-  Weaning him off could expose him to severe risks.  he started on that medication 18 month ago. All if it started around the same time.     Chief complaint according to patient : " I am sleepy- sleepy"  Sleep habits are as follows: Midnight is bedtime , no trouble to fall asleep.  He sleeps alone in his room,with  his dog, on two pillows and  in any sleep position.  He has dreams of being robbed , being chased, and very lucid and very real. Not every night. Does not report consecutive sleep.  Bedroom is cool , quiet and dark.  No nocturia. He wakes up frequently , is not sure if dream related. He has to rise at 9 AM - but stays until 9.20 AM and has difficulties to get up. Often late to school.  Wakes non refreshed, sleepy, dry mouth, almost daily headaches. Not nausea - no vision changes, no photophobia.   Sleep medical history and family sleep history:  Mother has EDS. No diagnosis yet. Tonsillectomy at age 30. Sleep walker in childhood, with night terrors, not recently.   Social history: lives with mother , and brother ( on weekends) , mother's fiancee and his daughter.  No tobacco use, no pot, No caffeine : not a caffeine drinker, he drinks sodas ( 40- 60 ounces Fr. Pepper each day monster drinks , (Taurin containing).  Review of Systems: Out of a complete 14 system review, the patient complains of only the following symptoms, and all other reviewed systems are negative. How likely are you to doze in the following situations: 0 = not likely, 1 = slight chance, 2 = moderate chance, 3 = high chance  Sitting and Reading? 2 Watching Television? 1 Sitting inactive in a public place (theater or meeting)? 2 Lying down in the afternoon when circumstances permit? 3 Sitting and talking to someone? 2 Sitting quietly after lunch without alcohol? 1In a car,  while stopped for a few minutes in traffic?1 As a passenger in a car for an hour without a break?3   07-22-2020; Total = 12 on XYREM - from 18/ 24   Fatigue severity score  52 ,  Depression score - currently not in therapy which he started in since 2019- and score was transferred from NP , PCP.  No sleep attacks, not falling asleep while driving.    [id number], RV PSG without apnea and MSLT with 2 out of 4 REM naps.  Confirmed diagnosis, narcolepsy and x cataplexy . I have the pleasure of meeting with Mr. Mahdi Frye and his mother today on 25 September 2017 following up on his PSG from 08 August 2017 followed by an M SLT the very next day, the study confirmed narcolepsy and cataplexy the patient had undergone a Swiss narcolepsy scale evaluation score of -2 was reached, his Epworth sleepiness score was endorsed at 15 out of 24 possible points, he had described cataplectic events.  He is also treated for ADHD ADD.  In spite of being treated on stimulants he had not felt the benefits to his daytime sleepiness.  Fatigue severity scale today is really very high at 50 points and his Epworth Sleepiness Scale right now is 18 out of 24 points.  Since Hever is 31 he is at this age able to take Xyrem.  Given that he has already been on stimulants is out of the desired success I would either add modafinil was Xyrem at this point and we discussed the pros and cons.  The process of Xyrem all but it also addresses cataplexy and not just narcolepsy, the negative is that it has to be taken twice each night and for some patients that is a difficult compliance issue. I will start XYREM after discussion with mom and patient, who live together. Marland Kitchen   10-21-2019: Patient returned after a more than 2 year hiatuus,  has weaned off all meds a year ago and is now stoo sleeepy to not be affected at work. He never started on XYREM- and he has not followed. nuvigil made depression worse.  We will need to address the sleepiness.  Epworth is 18/ 24 today . He lives with 2 room mates.     Social History   Socioeconomic History  . Marital status: Single    Spouse name: Not on file  . Number of children: 0  . Years of education: Not on file  . Highest education level: Not on file  Occupational History  . Not on file  Tobacco Use  . Smoking status: Passive Smoke Exposure - Never Smoker  . Smokeless tobacco: Never Used  Vaping Use  . Vaping Use: Never used  Substance and Sexual Activity  . Alcohol use: No  . Drug use: No  . Sexual activity: Never  Other Topics Concern  . Not on file  Social History Narrative  . Not on file   Social Determinants of Health   Financial Resource Strain: Not on file  Food Insecurity: Not on file  Transportation Needs: Not on file  Physical Activity: Not on file  Stress: Not on file  Social Connections: Not on file  Intimate Partner Violence: Not on file    Family History  Problem Relation Age of Onset  . Anxiety disorder Mother   . Thyroid disease Mother   . Depression Mother   . Other Maternal Grandfather     Past Medical History:  Diagnosis Date  . Anxiety   . Depression   . Headache(784.0)   . Narcolepsy   . Pectus excavatum   . Syncope and collapse     Past Surgical History:  Procedure Laterality Date  . TONSILLECTOMY AND ADENOIDECTOMY N/A 07/26/2012   Procedure: TONSILLECTOMY AND ADENOIDECTOMY;  Surgeon: Jerrell Belfast, MD;  Location: [place];  Service: ENT;  Laterality: N/A;    Current Outpatient Medications  Medication Sig Dispense Refill  . Sodium Oxybate (XYREM) 500 MG/ML SOLN 4.5 G twice nightly (Patient taking differently: 3 G twice nightly (pt is taking 3 G twice nightly)) 540 mL 0   No current facility-administered medications for this visit.    Allergies as of 07/22/2020 - Review Complete 07/22/2020  Allergen Reaction Noted  . Nuvigil [armodafinil] Other (See Comments) 11/07/2017  . Other  01/22/2019     Vitals: BP (!) 117/51   Pulse 66   Ht 5' 10.5" (1.791 m)   Wt 172 lb (78 kg)   BMI 24.33 kg/m  Last Weight:  Wt Readings from Last 1 Encounters:  07/22/20 172 lb (78 kg)   WUJ:WJXB mass index is 24.33 kg/m.     Last Height:   Ht Readings from Last 1 Encounters:  07/22/20 5' 10.5" (1.791 m)    Physical exam:  General: The patient is awake, alert and appears not in acute distress. The patient is well groomed. Head: Normocephalic, atraumatic. Neck is supple. Mallampati 1- open wide ,  neck circumference: 16. 25  Nasal airflow patent ,  Retrognathia is seen.  Cardiovascular:  Regular rate and rhythm ,  without distended neck veins. Respiratory: Lungs are clear to auscultation. Skin:  Without evidence of edema, or rash Trunk: BMI is 26. 3 The patient's posture is erect,    Neurologic exam : The patient is awake and alert, oriented to place and time.    Attention span & concentration ability appears normal  Speech is  fluent, without  dysarthria, dysphonia or aphasia.  Mood and affect are appropriate.  Cranial nerves: Pupils are equally dilated - very large - and briskly reactive to light. Funduscopic exam without evidence of pallor or edema. Extraocular movements  in vertical and horizontal planes intact and without nystagmus. Visual fields by finger perimetry are intact. . Facial sensation intac.  Facial motor strength is symmetric and tongue , uvula move midline.  Shoulder shrug was symmetrical.   Motor exam:   Normal tone, muscle bulk and symmetric strength in all extremities. Sensory:  Fine touch, pinprick and vibration were tested in all extremities.Coordination: Rapid alternating movements in the fingers/hands was normal. Finger-to-nose maneuver  normal without evidence of ataxia, dysmetria or tremor. Gait and station: Patient walks without assistive device and is able unassisted to climb up to the exam table. Strength within normal limits. Stance is stable and normal.  Deep tendon reflexes: in the  upper and lower extremities are symmetric.   Assessment:  After physical and neurologic examination, review of laboratory studies,  Personal review of imaging studies, reports of other /same  Imaging studies, results of polysomnography and / or neurophysiology testing and pre-existing records as far as provided in visit., my assessment is   1)Narcolepsy with cataplexy.    Epworth 18-24 points on7-09-2019, high level  indeed, excessive daytime sleepiness.  Unsafe to drive by his own assessment. He dozes off while not physically active and/ or not mentally stimulated.  Trouble to rise in AM, delayed bedtime.  Had positive  HLA test, not a great response of  modafinil. Weaning off Prozac was coordinated with his counselor for the positive MSLT.MSLT confirmed  Narcolepsy- with cataplexy now.    ADHD not treated to adderall, ritalin. Start XYREM.     A brief recap of his current work-up the patient had an echocardiogram completed on 3-17 2022 with Dr. Rejeana Brock and it was a normal diastolic filling pattern his ejection fraction is 71% which is great has a normal shaped aortic valve no stenosis no evidence of pulmonary elevated pressures, he was referred by Everardo Beals for syncope originally to [place] [facility] and reportedly had about 4 syncopal episodes over several years.  This was before he was even diagnosed with narcolepsy and before he started on Xyrem.  Other medicated medical history includes anxiety-headaches-narcolepsy without cataplexy and pectus excavatum syncope.  Recent labs as of January of this year BUN/creatinine normal 9/0.97 glucose 75 glomerular filtration rate normal sodium 142 potassium 4.5.  Platelets 187 MCV 92 H&H 15/44, normal glucose level he was not hypo or hyperglycemic.  The by history it was interpreted as a possible vasovagal syncope but I am suspicious about now is however that he held on to the water bottle or it may have  been crushed by his hand which in a syncope usually will not be the case.  He also bit his tongue this was not an injury from the external pressure into the oral cavity this was a grinding activity between the teeth.  So I am more worried about seizure activity I would like for him to reduce the sodium extubated also is not known to be a seizure provoking drug to the lowest dose possible to control his sleepiness.  As discussed, Xyrem has to be taken with very mindful caution: Taking Xyrem correctly is key. This means, take it only when you are fully ready to fall asleep, while in bed and refrain from doing any other activities, even brushing  your  teeth after taking your first dose. The second dose will be about 2-1/2-4 hours after his first dose. You can go to the bathroom before your 2nd dose. Take your first dose, when actually IN BED, ready to sleep. No sitting up in bed, NO reading, NO using the cell phone or computer, NO getting up to use the bathroom. Take care of everything BEFORE sleep time. Try NOT to skip the second dose as the Xyrem is not going to stay in your system long enough with only one dose. Do not drink alcohol with Xyrem. If you do drink Alcohol, you cannot take your Xyrem doses that night.    The patient was advised of the nature of the diagnosed disorder , the treatment options and the  risks for general health and wellness arising from not treating the condition.   I spent more than 20 minutes of face to face time with the patient.  Greater than 50% of time was spent in counseling and coordination of care. We have discussed the diagnosis and differential and I answered the patient's questions.      EEG , MRI brain - with and without , EEG 30 minutes. HV ,strobe.   Advised to get good sleep, eat regular, hydrate well and not to operate machinery until we meet again- I am suspecious of a single seizure but not sure. No medications.  RV 1-2 month       Larey Seat, MD  06/17/6710, 4:58 PM  Certified in Neurology by ABPN Certified in [place] by [facility] [address] [place], [place] [address]

## 2020-07-23 LAB — CBC WITH DIFFERENTIAL/PLATELET
Basophils Absolute: 0 10*3/uL (ref 0.0–0.2)
Basos: 1 %
EOS (ABSOLUTE): 0 10*3/uL (ref 0.0–0.4)
Eos: 1 %
Hematocrit: 42.7 % (ref 37.5–51.0)
Hemoglobin: 14.7 g/dL (ref 13.0–17.7)
Immature Grans (Abs): 0 10*3/uL (ref 0.0–0.1)
Immature Granulocytes: 0 %
Lymphocytes Absolute: 2.1 10*3/uL (ref 0.7–3.1)
Lymphs: 38 %
MCH: 30.9 pg (ref 26.6–33.0)
MCHC: 34.4 g/dL (ref 31.5–35.7)
MCV: 90 fL (ref 79–97)
Monocytes Absolute: 0.4 10*3/uL (ref 0.1–0.9)
Monocytes: 6 %
Neutrophils Absolute: 3 10*3/uL (ref 1.4–7.0)
Neutrophils: 54 %
Platelets: 186 10*3/uL (ref 150–450)
RBC: 4.76 x10E6/uL (ref 4.14–5.80)
RDW: 11.9 % (ref 11.6–15.4)
WBC: 5.6 10*3/uL (ref 3.4–10.8)

## 2020-07-23 LAB — COMPREHENSIVE METABOLIC PANEL
ALT: 14 IU/L (ref 0–44)
AST: 14 IU/L (ref 0–40)
Albumin/Globulin Ratio: 2.1 (ref 1.2–2.2)
Albumin: 5.1 g/dL (ref 4.1–5.2)
Alkaline Phosphatase: 51 IU/L (ref 51–125)
BUN/Creatinine Ratio: 12 (ref 9–20)
BUN: 11 mg/dL (ref 6–20)
Bilirubin Total: 0.8 mg/dL (ref 0.0–1.2)
CO2: 28 mmol/L (ref 20–29)
Calcium: 10.3 mg/dL — ABNORMAL HIGH (ref 8.7–10.2)
Chloride: 101 mmol/L (ref 96–106)
Creatinine, Ser: 0.93 mg/dL (ref 0.76–1.27)
Globulin, Total: 2.4 g/dL (ref 1.5–4.5)
Glucose: 72 mg/dL (ref 65–99)
Potassium: 4.5 mmol/L (ref 3.5–5.2)
Sodium: 145 mmol/L — ABNORMAL HIGH (ref 134–144)
Total Protein: 7.5 g/dL (ref 6.0–8.5)
eGFR: 121 mL/min/{1.73_m2} (ref >59)

## 2020-07-23 NOTE — Progress Notes (Signed)
Natrium/ Calcium were elevated, this can be related to XYREM/ XYWAV. We will just monitor, no changes.

## 2020-07-26 ENCOUNTER — Telehealth: Payer: Self-pay | Admitting: Neurology

## 2020-07-26 NOTE — Telephone Encounter (Signed)
no to the covid questions MR Brain w/wo contrast Dr. Thomasene Lot Berkley Harvey: [phone] (exp. 07/22/20 to 01/17/21). Patient is scheduled at [facility] for 07/28/20.

## 2020-07-28 ENCOUNTER — Ambulatory Visit: Payer: BC Managed Care – PPO

## 2020-07-28 ENCOUNTER — Other Ambulatory Visit: Payer: Self-pay

## 2020-07-28 DIAGNOSIS — G47411 Narcolepsy with cataplexy: Secondary | ICD-10-CM | POA: Diagnosis not present

## 2020-07-28 DIAGNOSIS — R55 Syncope and collapse: Secondary | ICD-10-CM | POA: Diagnosis not present

## 2020-07-28 DIAGNOSIS — G478 Other sleep disorders: Secondary | ICD-10-CM | POA: Diagnosis not present

## 2020-07-28 MED ORDER — GADOBENATE DIMEGLUMINE 529 MG/ML IV SOLN
15.0000 mL | Freq: Once | INTRAVENOUS | Status: AC | PRN
  Administered 2020-07-28: 15 mL via INTRAVENOUS

## 2020-08-02 NOTE — Progress Notes (Signed)
  Normal MRI brain with and without contrast. Suanne Marker, MD  EEG is pending.   Please establish a PCP

## 2020-08-03 ENCOUNTER — Encounter: Payer: Self-pay | Admitting: Neurology

## 2020-08-04 ENCOUNTER — Ambulatory Visit: Payer: BC Managed Care – PPO | Admitting: Adult Health

## 2020-08-18 ENCOUNTER — Other Ambulatory Visit: Payer: Self-pay

## 2020-08-18 ENCOUNTER — Ambulatory Visit: Payer: BC Managed Care – PPO

## 2020-08-18 DIAGNOSIS — R55 Syncope and collapse: Secondary | ICD-10-CM

## 2020-08-18 DIAGNOSIS — G478 Other sleep disorders: Secondary | ICD-10-CM

## 2020-08-18 DIAGNOSIS — G47411 Narcolepsy with cataplexy: Secondary | ICD-10-CM

## 2020-09-02 NOTE — Progress Notes (Deleted)
   [facility]  EEG (ELECTROENCEPHALOGRAM) REPORT    STUDY DATE: 08/18/20 PATIENT NAME: Dustin Pace DOB: 05/12/2000 MRN: [id number]  ORDERING CLINICIAN: Carmen Dohmeier, MD   TECHNOLOGIST: N Williard TECHNIQUE: Electroencephalogram was recorded utilizing standard 10-20 system of lead placement and reformatted into average and bipolar montages.  RECORDING TIME: 34 minutes  ACTIVATION: hyperventilation and photic stimulation  CLINICAL INFORMATION: 19 year old male with abnormal spells.  FINDINGS: Posterior dominant background rhythms, which attenuate with eye opening, ranging 10-11 hertz and 50-60 microvolts. No focal, lateralizing, epileptiform activity or seizures are seen. Patient recorded in the awake and drowsy state. EKG channel shows regular rhythm of 50-60 beats per minute.   IMPRESSION:   Normal EEG in the awake and drowsy states.     INTERPRETING PHYSICIAN:  Jadia Capers R. Calyssa Zobrist, MD Certified in Neurology, Neurophysiology and Neuroimaging  [facility] [address], [address] [place], [place] [address] [phone] 

## 2020-09-02 NOTE — Procedures (Signed)
   [facility]  EEG (ELECTROENCEPHALOGRAM) REPORT    STUDY DATE: 08/18/20 PATIENT NAME: Dustin Pace DOB: [birth year] MRN: [id number]  ORDERING CLINICIAN: Melvyn Novas, MD   TECHNOLOGIST: Ardis Hughs TECHNIQUE: Electroencephalogram was recorded utilizing standard 10-20 system of lead placement and reformatted into average and bipolar montages.  RECORDING TIME: 34 minutes  ACTIVATION: hyperventilation and photic stimulation  CLINICAL INFORMATION: 20 year old male with abnormal spells.  FINDINGS: Posterior dominant background rhythms, which attenuate with eye opening, ranging 10-11 hertz and 50-60 microvolts. No focal, lateralizing, epileptiform activity or seizures are seen. Patient recorded in the awake and drowsy state. EKG channel shows regular rhythm of 50-60 beats per minute.   IMPRESSION:   Normal EEG in the awake and drowsy states.     INTERPRETING PHYSICIAN:  Suanne Marker, MD Certified in Neurology, Neurophysiology and Neuroimaging  [facility] [address], [address] [place], [place] [address] [phone]

## 2020-09-06 ENCOUNTER — Encounter: Payer: Self-pay | Admitting: Neurology

## 2020-09-06 NOTE — Progress Notes (Signed)
Dr. Marjory Lies read this study- normal EEG

## 2020-09-28 ENCOUNTER — Encounter: Payer: Self-pay | Admitting: Neurology

## 2020-09-28 ENCOUNTER — Other Ambulatory Visit: Payer: Self-pay

## 2020-09-28 ENCOUNTER — Ambulatory Visit: Payer: BC Managed Care – PPO | Admitting: Neurology

## 2020-09-28 VITALS — BP 104/59 | HR 48 | Ht 71.0 in | Wt 174.0 lb

## 2020-09-28 DIAGNOSIS — F339 Major depressive disorder, recurrent, unspecified: Secondary | ICD-10-CM

## 2020-09-28 DIAGNOSIS — G478 Other sleep disorders: Secondary | ICD-10-CM

## 2020-09-28 DIAGNOSIS — G47411 Narcolepsy with cataplexy: Secondary | ICD-10-CM

## 2020-09-28 MED ORDER — XYREM 500 MG/ML PO SOLN: ORAL | 0 refills | Status: DC

## 2020-09-28 NOTE — Progress Notes (Signed)
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  He is currently taking Xyrem and tolerating it well.  He states that he does not fall asleep while at work or at school.   Denies any trouble driving no depression or thoughts of suicide.   Patient mentions t infrequently having headaches sometimes behind the right, sometimes behind the left eye.  Does not have a diagnosis of migraine headaches, no known to have visual auras. MRI brain normal, dr Marjory Lies. EEG - normal.      HISTORY (Copied from Dr.Geovanny Sartin's note) Dustin Pace is a 20 y.o. male , seen here as in a referral  from Counselor Gibbons  to be evaluated for narcolepsy.    Dustin Pace is seen here today in the presence of his mother, he has been referred by his counselor, and has been treated for ADHD for about a year now.  The patient also mentioned to her that he has been excessively daytime sleepy and this started during high school.  He was not and excessively sleepy toddler, elementary school student and even in middle school seems not to have had trouble with sleep attacks or sudden irresistible urges to go to sleep.  As part of symptom screening he underwent an Epworth sleepiness score examination and scored 15 points.  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   He recalls isolated events where he may have lost muscle tone in response to emotional upset, anger, startle or a deep black cough.  He does not feel that this has been impairing his ability to expose himself in social situations etc. it is not a frequent or anticipated phenomenon for him.     In today's questionnaires he scored 48 points on the fatigue severity score which is high and the Epworth sleepiness  score was once again endorsed at 15 points.  Dustin Pace also has noted that his sleepiness persists if he is on ADHD medication or not. 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 Intunef is his  most recent ADHD non stimulant medication and he remains EDS ( excessively daytime sleepy).   His counselor is concerned that he is at risk of failing the 12th grade due to excessive absences in the morning.  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  For this reason we need now to evaluate him for the possibility of a sleep disorder. He is on Prozac- for anxiety and depression, and for suicidal thoughts. Not an easy decision-  Weaning him off could expose him to severe risks.  he started on that medication 18 month ago. All if it started around the same time.        Chief complaint according to patient : " I am sleepy- sleepy"   Sleep habits are as follows: Midnight is bedtime , no trouble to fall asleep.  He sleeps alone in his room,with his dog, on two pillows and  in any sleep position.  He has dreams of  being robbed , being chased, and very lucid and very real. Not every night. Does not report consecutive sleep.  Bedroom is cool , quiet and dark.  No nocturia. He wakes up frequently , is not sure if dream related. He has to rise at 9 AM - but stays until 9.20 AM and has difficulties to get up. Often late to school.  Wakes non refreshed, sleepy, dry mouth, almost daily headaches. Not nausea - no vision changes, no photophobia.    Sleep medical history and family sleep history:  Mother has EDS. No diagnosis yet. Tonsillectomy at age 45. Sleep walker in childhood, with night terrors, not recently.    Social history: lives with mother  , and brother ( on weekends) , mother's fiancee and his daughter.  No tobacco use, no pot, No caffeine : not a caffeine drinker, he drinks sodas ( 40- 60 ounces Fr. Pepper each day monster drinks , (Taurin containing).  REVIEW OF SYSTEMS: Out of a complete 14 system review of symptoms, the patient complains only of the following symptoms, and all other reviewed systems are negative.  See HPI  How likely are you to doze in the following situations: 0 = not likely, 1 = slight chance, 2 = moderate chance, 3 = high chance  Sitting and Reading? 1 Watching Television? 1 Sitting inactive in a public place (theater or meeting)? 0 Lying down in the afternoon when circumstances permit? 3 Sitting and talking to someone?0 Sitting quietly after lunch without alcohol?2 In a car, while stopped for a few minutes in traffic? 0 As a passenger in a car for an hour without a break?1  Total = 8 points. On XYREM.    ALLERGIES: Allergies  Allergen Reactions   Nuvigil [Armodafinil] Other (See Comments)    Suicidal ideations   Other     Stimulants  - increase HR and BP     HOME MEDICATIONS: Outpatient Medications Prior to Visit  Medication Sig Dispense Refill   Sodium Oxybate (XYREM) 500 MG/ML SOLN 3 G twice nightly (pt is taking 3 G twice nightly) 540 mL 0   No facility-administered medications prior to visit.    PAST MEDICAL HISTORY: Past Medical History:  Diagnosis Date   Anxiety    Depression    Headache(784.0)    Narcolepsy    Pectus excavatum    Syncope and collapse     PAST SURGICAL HISTORY: Past Surgical History:  Procedure Laterality Date   TONSILLECTOMY AND ADENOIDECTOMY N/A 07/26/2012   Procedure: TONSILLECTOMY AND ADENOIDECTOMY;  Surgeon: Osborn Coho, MD;  Location: [facility];  Service: ENT;  Laterality: N/A;    FAMILY HISTORY: Family History  Problem Relation Age of Onset   Anxiety disorder Mother    Thyroid disease Mother    Depression Mother     Other Maternal Grandfather     SOCIAL HISTORY: Social History   Socioeconomic History   Marital status: Single    Spouse name: Not on file   Number of children: 0   Years of education: Not on file   Highest education level: Not on file  Occupational History   Not on file  Tobacco Use   Smoking status: Never    Passive exposure: Yes   Smokeless tobacco: Never  Vaping Use   Vaping Use: Never used  Substance and Sexual Activity   Alcohol use: No   Drug use: No   Sexual activity: Never  Other Topics Concern   Not on file  Social History Narrative   Not on file   Social Determinants of Health   Financial Resource Strain: Not on file  Food Insecurity: Not on file  Transportation Needs: Not on file  Physical Activity: Not on file  Stress: Not on file  Social Connections: Not on file  Intimate Partner Violence: Not on file      PHYSICAL EXAM  Vitals:   09/28/20 1536  BP: (!) 104/59  Pulse: (!) 48  Weight: 174 lb (78.9 kg)  Height: 5\' 11"  (1.803 m)   Body mass index is 24.27 kg/m.  Generalized: Well developed, in no acute distress ,  Neurological examination  Mentation: Alert oriented to time, place, history taking. Follows all commands speech and language fluent Cranial nerve : Pupils were equal round reactive to light. Extraocular movements were full, visual field were full on confrontational test.  Head turning and shoulder shrug  were normal and symmetric. Motor: full strength of all 4 extremities. Good symmetric motor tone is noted throughout.  Sensory: Sensory testing is intact to soft touch on all 4 extremities. No evidence of extinction is noted.  Coordination: no changes in penmanship Gait and station: Gait is intact  Reflexes: Deep tendon reflexes are symmetric and normal bilaterally.   DIAGNOSTIC DATA (LABS, IMAGING, TESTING) - I reviewed patient records, labs, notes, testing and imaging myself where available.  Lab Results  Component Value Date    WBC 5.6 07/22/2020   HGB 14.7 07/22/2020   HCT 42.7 07/22/2020   MCV 90 07/22/2020   PLT 186 07/22/2020      Component Value Date/Time   NA 145 (H) 07/22/2020 1505   K 4.5 07/22/2020 1505   CL 101 07/22/2020 1505   CO2 28 07/22/2020 1505   GLUCOSE 72 07/22/2020 1505   GLUCOSE 84 11/17/2016 2003   BUN 11 07/22/2020 1505   CREATININE 0.93 07/22/2020 1505   CALCIUM 10.3 (H) 07/22/2020 1505   PROT 7.5 07/22/2020 1505   ALBUMIN 5.1 07/22/2020 1505   AST 14 07/22/2020 1505   ALT 14 07/22/2020 1505   ALKPHOS 51 07/22/2020 1505   BILITOT 0.8 07/22/2020 1505   GFRNONAA 109 10/21/2019 0913   GFRAA 126 10/21/2019 0913   No results found for: CHOL, HDL, LDLCALC, LDLDIRECT, TRIG, CHOLHDL No results found for: 12/22/2019 No results found for: VITAMINB12 Lab Results  Component Value Date   TSH 0.955 01/22/2019      ASSESSMENT AND PLAN 20 y.o. year old male  has a past medical history of Anxiety, Depression, Headache(784.0), Narcolepsy, Pectus excavatum, and Syncope and collapse. here with :  1.  Narcolepsy with cataplexy, sleep paralysis.  -Continue Xyrem at 3 g twice ngtly, better tolerated than 4.5 g dose. He felt nauseated. .  -Blood work in July was unremarkable, MRI and EEG were normal.  -Advised if symptoms worsen or he develops new symptoms he should let August know -Follow-up in 6 months with NP.   I spent 25 minutes of face-to-face and non-face-to-face time with patient.  This included previsit chart review, lab review, study review, order entry, electronic health record documentation, patient education.  [place], MD  09/28/2020, 3:42 PM [facility] [address] [place], [place] [phone]

## 2020-09-28 NOTE — Patient Instructions (Signed)
As discussed, Xyrem has to be taken with very mindful caution: Taking Xyrem correctly is key. This means, take it only when you are fully ready to fall asleep, while in bed and refrain from doing any other activities, even brushing  your teeth after taking your first dose. The second dose will be about 2-1/2-4 hours after his first dose. You can go to the bathroom before your 2nd dose. Take your first dose, when actually IN BED, ready to sleep. No sitting up in bed, NO reading, NO using the cell phone or computer, NO getting up to use the bathroom. Take care of everything BEFORE sleep time. Try NOT to skip the second dose as the Xyrem is not going to stay in your system long enough with only one dose. Do not drink alcohol with Xyrem. If you do drink Alcohol, you cannot take your Xyrem doses that night.   Narcolepsy Narcolepsy is a neurological disorder that causes people to fall asleep suddenly and without control (have sleep attacks) during the daytime. It is a lifelong disorder. Narcolepsy disrupts the sleepcycle at night, which then causes daytime sleepiness. What are the causes? The cause of narcolepsy is not fully understood, but it may be related to: Low levels of hypocretin, a chemical (neurotransmitter) in the brain that controls sleep and wake cycles. Hypocretin imbalance may be caused by: Abnormal genes that are passed from parent to child (inherited). An autoimmune disease in which the body's defense system (immune system) attacks the brain cells that make hypocretin. Infection, tumor, or injury in the area of the brain that controls sleep. Exposure to poisons (toxins), such as heavy metals, pesticides, and secondhand smoke. What are the signs or symptoms? Symptoms of this condition include: Excessive daytime sleepiness. This is the most common symptom and is usually the first symptom you will notice. This may affect your performance at work or school. Sleep attacks. You may fall asleep in  the middle of an activity, especially low-energy activities like reading or watching TV. Feeling like you cannot think clearly and trouble focusing or remembering things. You may also feel depressed. Sudden muscle weakness (cataplexy). When this occurs, your speech may become slurred, or your knees may buckle. Cataplexy is usually triggered by surprise, anger, fear, or laughter. Losing the ability to speak or move (sleep paralysis). This may occur just as you start to fall asleep or wake up. You will be aware of the paralysis. It usually lasts for just a few seconds or minutes. Seeing, hearing, tasting, smelling, or feeling things that are not real (hallucinations). Hallucinations may occur with sleep paralysis. They can happen when you are falling asleep, waking up, or dozing. Trouble staying asleep at night (insomnia) and restless sleep. How is this diagnosed? This condition may be diagnosed based on: A physical exam to rule out any other problems that may be causing your symptoms. You may be asked to write down your sleeping patterns for several weeks in a sleep diary. This will help your health care provider make a diagnosis. Sleep studies that measure how well your REM sleep is regulated. These tests also measure your heart rate, breathing, movement, and brain waves. These tests include: An overnight sleep study (polysomnogram). A daytime sleep study that is done while you take several naps during the day (multiple sleep latency test, MSLT). This test measures how quickly you fall asleep and how quickly you enter REM sleep. Removal of spinal fluid to measure hypocretin levels. How is this treated? There is  no cure for this condition, but treatment can help relieve symptoms. Treatment may include: Lifestyle and sleeping strategies to help you cope with the condition, such as: Exercising regularly. Maintaining a regular sleep schedule. Avoiding caffeine and large meals before bed. Medicines.  These may include: Medicines that help keep you awake and alert (stimulants) to fight daytime sleepiness. Medicines that treat depression (antidepressants). These may be used to treat cataplexy. Sodium oxybate. This is a strong medicine to help you relax (sedative) that you may take at night. It can help control daytime sleepiness and cataplexy. Other treatments may include mental health counseling or joining a support group. Follow these instructions at home: Sleeping habits  Get about 8 hours of sleep every night. Go to sleep and get up at about the same time every day. Keep your bedroom dark, quiet, and comfortable. When you feel very tired, take short naps. Schedule naps so that you take them at about the same time every day. Before bedtime: Avoid bright lights and screens. Relax. Try activities like reading or taking a warm bath.  Activity Get at least 20 minutes of exercise every day. This will help you sleep better at night and reduce daytime sleepiness. Avoid exercising within 3 hours of bedtime. Do not drive or use heavy machinery if you are sleepy. If possible, take a nap before driving. Do not swim or go out on the water without a life jacket. Eating and drinking Do not drink alcohol or caffeinated beverages within 4-5 hours of bedtime. Do not eat a large meal before bedtime. Eat meals at about the same times every day. General instructions  Take over-the-counter and prescription medicines only as told by your health care provider. Keep a sleep diary as told by your health care provider. Tell your employer or teachers that you have narcolepsy. You may be able to adjust your schedule to include time for naps. Do not use any products that contain nicotine or tobacco, such as cigarettes, e-cigarettes, and chewing tobacco. If you need help quitting, ask your health care provider. Keep all follow-up visits as told by your health care provider. This is important.  Where to find  more information [employer] of Neurological Disorders: [employer] Contact a health care provider if: Your symptoms are not getting better. You have increasingly high blood pressure (hypertension). You have changes in your heart rhythm. You are having a hard time determining what is real and what is not (psychosis). Get help right away if you: Hurt yourself during a sleep attack or an attack of cataplexy. Have chest pain. Have trouble breathing. These symptoms may represent a serious problem that is an emergency. Do not wait to see if the symptoms will go away. Get medical help right away. Call your local emergency services (911 in the [place] Do not drive yourself to the hospital. Summary Narcolepsy is a neurological disorder that causes people to fall asleep suddenly, and without control, during the daytime (sleep attacks). It is a lifelong disorder. There is no cure for this condition, but treatment can help relieve symptoms. Go to sleep and get up at about the same time every day. Follow instructions about sleep and activities as told by your health care provider. Take over-the-counter and prescription medicines only as told by your health care provider. This information is not intended to replace advice given to you by your health care provider. Make sure you discuss any questions you have with your healthcare provider. Document Revised: 11/13/2018 Document Reviewed: 11/13/2018 Elsevier  Elsevier Patient Education  2022 Elsevier Inc.  

## 2020-11-11 ENCOUNTER — Ambulatory Visit: Payer: BC Managed Care – PPO | Admitting: Cardiology

## 2020-11-11 ENCOUNTER — Encounter: Payer: Self-pay | Admitting: Cardiology

## 2020-11-11 ENCOUNTER — Other Ambulatory Visit: Payer: Self-pay

## 2020-11-11 VITALS — BP 127/77 | HR 64 | Temp 98.2°F | Resp 16 | Ht 71.0 in | Wt 176.0 lb

## 2020-11-11 DIAGNOSIS — R55 Syncope and collapse: Secondary | ICD-10-CM

## 2020-11-11 NOTE — Progress Notes (Signed)
Patient referred by No ref. provider found for syncope  Subjective:   Dustin Pace, male    DOB: Mar 23, 2001, 20 y.o.   MRN: [id number]   Chief Complaint  Patient presents with   Syncope and collapse   Follow-up    6 month     HPI  20 y.o. Caucasian male with depression, anxiety, narcolepsy, referred for evaluation of syncope.  Patient is doing well. He ha snot had any recurrent syncope. He has no complaints today.   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  He has had at least 4 syncope episodes over the years.  Most recent episode occurred about a month ago.  He was playing video games at 3 AM Saturday, which is usual practice for him.  He had been sitting at his desk and looking at computer screen for several hours.  He suddenly had a sensation of "stomach turning", and nausea.  He tried to reach for water on his desk, followed by flush sensation and then loss of consciousness.  He found himself on the floor.  He had likely bitten his tongue, without any bleeding.  There was no loss of bowel or bladder tone.  He reckons he would have been on for about 30 seconds.  He has had couple other episodes of syncope in the past.  1 episode occurred while coming out of a hot shower.  Further episode occurred during a blood draw.  1 other episode occurred after having intense retrosternal pain while eating steak at a restaurant.  He was noted to have shaking episodes after his syncope at the restaurant.  Patient previously seen a pediatric cardiologist at [facility] in 2018 for murmur.  Echocardiogram then had showed mild aortic regurgitation.  On a separate note, patient is being treated by Dr. Asencion Partridge Dohmeier for narcolepsy.  He has not had any episodes of syncope, or narcolepsy, during driving.  At baseline, he walks at work, plays vascular at least once a week.  He denies chest  pain, shortness of breath, palpitations, leg edema, orthopnea, PND, TIA.  Current Outpatient Medications on File Prior to Visit  Medication Sig Dispense Refill   Sodium Oxybate (XYREM) 500 MG/ML SOLN 3 G twice nightly (pt is taking 3 G twice nightly) 540 mL 0   No current facility-administered medications on file prior to visit.    Cardiovascular and other pertinent studies:  Echocardiogram 07/01/2020:  Left ventricle cavity is normal in size and wall thickness. Normal global  wall motion. Normal LV systolic function with EF 71%. Normal diastolic  filling pattern.  Structurally normal trileaflet aortic valve. No evidence of aortic  stenosis. Mild (Grade I) aortic regurgitation.  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  No evidence of pulmonary hypertension.  EKG 05/21/2020: Sinus rhythm 57 bpm with sinus arrhtymia Incomplete right bundle branch block   Recent labs: 07/22/2020: Glucose 72, BUN/Cr 11/0.93. EGFR normal. Na/K 145/4.5. Rest of the CMP normal H/H 14/42. MCV 90. Platelets 186  01/2019: TSH normal   Review of Systems  Cardiovascular:  Positive for syncope. Negative for chest pain, dyspnea on exertion, leg swelling and palpitations.        Vitals:   11/11/20 1155  BP: 127/77  Pulse: 64  Resp: 16  Temp: 98.2 F (36.8 C)  SpO2: 98%     Body mass index is 24.55 kg/m. Filed Weights   11/11/20 1155  Weight:  176 lb (79.8 kg)     Orthostatic vitals: Supine: 120/58 mmHg, HR 61/min Sitting: 109/62 mmHg, HR 79/min standing: 114/69 mmHg, HR 74/min    Objective:   Physical Exam Vitals and nursing note reviewed.  Constitutional:      General: He is not in acute distress. Neck:     Vascular: No JVD.  Cardiovascular:     Rate and Rhythm: Normal rate and regular rhythm.     Heart sounds: Normal heart sounds. No murmur heard. Pulmonary:     Effort: Pulmonary effort is normal.     Breath sounds: Normal breath sounds. No wheezing or rales.         Assessment  & Recommendations:   20 y.o. Caucasian male with depression, anxiety, narcolepsy,vasovagal syncope.  No recurrence. Structurally normal heart. Discussed liberal hydration, compression stockings. Discussed counter pressure maneuvers.    Nigel Mormon, MD Pager: [phone] Office: [phone]

## 2020-11-19 ENCOUNTER — Ambulatory Visit: Payer: BC Managed Care – PPO | Admitting: Cardiology

## 2021-01-17 ENCOUNTER — Telehealth: Payer: Self-pay | Admitting: Neurology

## 2021-01-17 NOTE — Telephone Encounter (Signed)
PA completed on CMM/[employer] OIT:[id number] Can take up to 72 hrs.

## 2021-01-19 NOTE — Telephone Encounter (Signed)
PA approved for the patient and is Effective from 01/17/2021 through 01/16/2022.

## 2021-03-21 ENCOUNTER — Telehealth: Payer: Self-pay | Admitting: Adult Health

## 2021-03-21 NOTE — Telephone Encounter (Signed)
LVM for mom to call us back to reschedule due to the office closing early on 12/15.

## 2021-03-31 ENCOUNTER — Ambulatory Visit: Payer: BC Managed Care – PPO | Admitting: Adult Health

## 2022-03-06 ENCOUNTER — Other Ambulatory Visit: Payer: Self-pay | Admitting: Neurology

## 2022-03-06 MED ORDER — SODIUM OXYBATE 500 MG/ML PO SOLN: ORAL | 0 refills | Status: DC

## 2022-03-07 ENCOUNTER — Telehealth (INDEPENDENT_AMBULATORY_CARE_PROVIDER_SITE_OTHER): Payer: BC Managed Care – PPO | Admitting: Adult Health

## 2022-03-07 DIAGNOSIS — G47411 Narcolepsy with cataplexy: Secondary | ICD-10-CM

## 2022-03-07 NOTE — Progress Notes (Signed)
PATIENT: Dustin Pace DOB: 24-Feb-2001  REASON FOR VISIT: follow up HISTORY FROM: patient  Virtual Visit via Video Note  I connected with Dustin Pace on 03/07/22 at  2:00 PM EST by a video enabled telemedicine application located remotely at [facility] and verified that I am speaking with the correct person using two identifiers who was located at their own home.   I discussed the limitations of evaluation and management by telemedicine and the availability of in person appointments. The patient expressed understanding and agreed to proceed.   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  He returns today for follow-up.  Reports that Xyrem continues to work well for him.  He states that he no longer falls asleep while driving.  He is able to get to work early in the morning.  States that he is now playing sports because he has more energy.  Overall he is feeling well.  Returns today for evaluation.  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  He returns today for follow-up.  He is currently taking Xyrem and tolerating it well.  He states that he does not fall asleep while at work or at school.  Denies any trouble driving.  Denies depression or thoughts of suicide.  Patient mentions that last week he had a headache behind his left eye that radiated to the back of the head.  He states that he will get flashes in his vision.  He had this several times last week.  Does not have a diagnosis of migraine headaches.  Reports that this week he has not had any issues.  I advised the patient that if he develops the symptoms again he may need a formal work-up for migraine headaches.  He returns today for an evaluation.  HISTORY (Copied from Dustin Pace's note) Dustin Pace is a  21 y.o. male , seen here as in a referral  from Dustin Pace  to be evaluated for narcolepsy.    Dustin Pace is seen here today in the presence of his mother, he has been referred by his counselor, and has been treated for ADHD for about a year now.  The patient also mentioned to her that he has been excessively daytime sleepy and this started during high school.  He was not and excessively sleepy toddler, elementary school student and even in middle school seems not to have had trouble with sleep attacks or sudden irresistible urges to go to sleep.  As part of symptom screening he underwent an Epworth sleepiness score examination and scored 15 points.  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   He recalls isolated events where he may have lost muscle tone in response to emotional upset, anger, startle or a deep black cough.  He does not feel that this has been impairing his ability to expose himself in social situations etc. it is not a frequent or anticipated phenomenon for him.     In today's questionnaires he scored 48 points on the fatigue severity score which is high and the Epworth sleepiness score was once again endorsed at 15 points.  Julyan also has noted that his sleepiness persists if he is on ADHD medication or  not. 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 Intunef is his  most recent ADHD non stimulant medication and he remains EDS ( excessively daytime sleepy).   His counselor is concerned that he is at risk of failing the 12th grade due to excessive absences in the morning.  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  For this  reason we need now to evaluate him for the possibility of a sleep disorder. He is on Prozac- for anxiety and depression, and for suicidal thoughts. Not an easy decision-  Weaning him off could expose him to severe risks.  he started on that medication 18 month ago. All if it started around the same time.        Chief complaint according to patient : " I am sleepy- sleepy"   Sleep habits are as follows: Midnight is bedtime , no trouble to fall asleep.  He sleeps alone in his room,with his dog, on two pillows and  in any sleep position.  He has dreams of being robbed , being chased, and very lucid and very real. Not every night. Does not report consecutive sleep.  Bedroom is cool , quiet and dark.  No nocturia. He wakes up frequently , is not sure if dream related. He has to rise at 9 AM - but stays until 9.20 AM and has difficulties to get up. Often late to school.  Wakes non refreshed, sleepy, dry mouth, almost daily headaches. Not nausea - no vision changes, no photophobia.    Sleep medical history and family sleep history:  Mother has EDS. No diagnosis yet. Tonsillectomy at age 58. Sleep walker in childhood, with night terrors, not recently.    Social history: lives with mother , and brother ( on weekends) , mother's fiancee and his daughter.  No tobacco use, no pot, No caffeine : not a caffeine drinker, he drinks sodas ( 40- 60 ounces Fr. Pepper each day monster drinks , (Taurin containing).  REVIEW OF SYSTEMS: Out of a complete 14 system review of symptoms, the patient complains only of the following symptoms, and all other reviewed systems are negative.  ALLERGIES: Allergies  Allergen Reactions   Nuvigil [Armodafinil] Other (See Comments)    Suicidal ideations   Other     Stimulants  - increase HR and BP     HOME MEDICATIONS: Outpatient Medications Prior to Visit  Medication Sig Dispense Refill   Sodium Oxybate (XYREM) 500 MG/ML SOLN 3 G twice nightly (pt is taking 3 G twice  nightly) 540 mL 0   No facility-administered medications prior to visit.    PAST MEDICAL HISTORY: Past Medical History:  Diagnosis Date   Anxiety    Depression    Headache(784.0)    Narcolepsy    Pectus excavatum    Syncope and collapse     PAST SURGICAL HISTORY: Past Surgical History:  Procedure Laterality Date   TONSILLECTOMY AND ADENOIDECTOMY N/A 07/26/2012   Procedure: TONSILLECTOMY AND ADENOIDECTOMY;  Surgeon: Jerrell Belfast, MD;  Location: [place];  Service: ENT;  Laterality: N/A;    FAMILY HISTORY: Family History  Problem Relation Age of Onset   Anxiety disorder Mother    Thyroid disease Mother  Depression Mother    Other Maternal Grandfather     SOCIAL HISTORY: Social History   Socioeconomic History   Marital status: Single    Spouse name: Not on file   Number of children: 0   Years of education: Not on file   Highest education level: Not on file  Occupational History   Not on file  Tobacco Use   Smoking status: Never    Passive exposure: Yes   Smokeless tobacco: Never  Vaping Use   Vaping Use: Never used  Substance and Sexual Activity   Alcohol use: No   Drug use: No   Sexual activity: Never  Other Topics Concern   Not on file  Social History Narrative   Not on file   Social Determinants of Health   Financial Resource Strain: Not on file  Food Insecurity: Not on file  Transportation Needs: Not on file  Physical Activity: Not on file  Stress: Not on file  Social Connections: Not on file  Intimate Partner Violence: Not on file      PHYSICAL EXAM Generalized: Well developed, in no acute distress   Neurological examination  Mentation: Alert oriented to time, place, history taking. Follows all commands speech and language fluent Cranial nerve II-XII:Extraocular movements were full. Facial symmetry noted. Marland Kitchen Head turning and shoulder shrug  were normal and symmetric.   DIAGNOSTIC DATA (LABS, IMAGING, TESTING) - I  reviewed patient records, labs, notes, testing and imaging myself where available.  Lab Results  Component Value Date   WBC 5.6 07/22/2020   HGB 14.7 07/22/2020   HCT 42.7 07/22/2020   MCV 90 07/22/2020   PLT 186 07/22/2020      Component Value Date/Time   NA 145 (H) 07/22/2020 1505   K 4.5 07/22/2020 1505   CL 101 07/22/2020 1505   CO2 28 07/22/2020 1505   GLUCOSE 72 07/22/2020 1505   GLUCOSE 84 11/17/2016 2003   BUN 11 07/22/2020 1505   CREATININE 0.93 07/22/2020 1505   CALCIUM 10.3 (H) 07/22/2020 1505   PROT 7.5 07/22/2020 1505   ALBUMIN 5.1 07/22/2020 1505   AST 14 07/22/2020 1505   ALT 14 07/22/2020 1505   ALKPHOS 51 07/22/2020 1505   BILITOT 0.8 07/22/2020 1505   GFRNONAA 109 10/21/2019 0913   GFRAA 126 10/21/2019 0913    Lab Results  Component Value Date   TSH 0.955 01/22/2019      ASSESSMENT AND PLAN 21 y.o. year old male  has a past medical history of Anxiety, Depression, Headache(784.0), Narcolepsy, Pectus excavatum, and Syncope and collapse. here with:  1.  Narcolepsy  Continue Xyrem We will check blood work at the next visit Advised if symptoms worsen or he develops new symptoms he should let us know Follow-up in 6 months or sooner if needed      Butch Penny, MSN, NP-C 03/07/2022, 2:09 PM [facility] [address] [place], [place] [address] [phone]

## 2022-03-27 ENCOUNTER — Encounter: Payer: Self-pay | Admitting: Adult Health

## 2022-03-27 ENCOUNTER — Other Ambulatory Visit: Payer: Self-pay | Admitting: *Deleted

## 2022-03-27 MED ORDER — SODIUM OXYBATE 500 MG/ML PO SOLN: ORAL | 5 refills | Status: DC

## 2022-03-29 NOTE — Telephone Encounter (Signed)
Faxed refills for xyrem this evening

## 2022-04-05 ENCOUNTER — Telehealth: Payer: Self-pay | Admitting: Neurology

## 2022-04-05 NOTE — Telephone Encounter (Signed)
PA completed through [employer] Office notes and SS submitted KEY: [id number] Will await determination

## 2022-04-11 NOTE — Telephone Encounter (Signed)
Marlena/ [facility] is calling. Requesting a med list be faxed over [phone].

## 2022-04-11 NOTE — Telephone Encounter (Signed)
Med list faxed to [facility].

## 2022-04-14 NOTE — Telephone Encounter (Signed)
Nika P from [facility] called stating that they are requesting Generic Xyrem due to pts insurance. Please call [phone] Option 3 and 4 with any questions.

## 2022-04-18 MED ORDER — SODIUM OXYBATE 500 MG/ML PO SOLN: ORAL | 5 refills | Status: DC

## 2022-04-18 NOTE — Telephone Encounter (Signed)
Sodium oxybate 500 mg/mL prescription has been signed by MM NP and faxed to Xyrem [facility] program. Received a receipt of confirmation.

## 2022-04-18 NOTE — Addendum Note (Signed)
Addended by: Gildardo Griffes on: 04/18/2022 11:40 AM   Modules accepted: Orders

## 2022-04-18 NOTE — Telephone Encounter (Signed)
Spoke with MM NP. Ok to switch to generic Xyrem for patient to try. Message to sent to patient via mychart to obtain his 2024 prescription drug insurance. Sodium Oxybate 500 mg/mL order form completed (3 g twice nightly) and also prescription has been printed and ready for MM NP's signature.

## 2022-04-26 ENCOUNTER — Telehealth: Payer: Self-pay | Admitting: *Deleted

## 2022-04-26 NOTE — Telephone Encounter (Signed)
Received fax that stated that we have received a report onl stating that the pt was on xyrem.  Wanting to know if pt had any adverse events, any sing or symptoms or diagnoisis while on xyrem.  He stated no when I called him.  Faxed form back to [phone].    To Medical records.

## 2022-05-03 NOTE — Telephone Encounter (Signed)
PA was approved Effective from 04/05/2022 through 04/04/2023. [id number]

## 2022-09-19 ENCOUNTER — Encounter: Payer: Self-pay | Admitting: Adult Health

## 2022-09-19 ENCOUNTER — Ambulatory Visit: Payer: BC Managed Care – PPO | Admitting: Adult Health

## 2022-09-19 VITALS — BP 122/75 | HR 56 | Ht 71.0 in | Wt 175.2 lb

## 2022-09-19 DIAGNOSIS — G47411 Narcolepsy with cataplexy: Secondary | ICD-10-CM

## 2022-09-19 NOTE — Patient Instructions (Signed)
Your Plan:  Continue Xyrem  Blood work today     Thank you for coming to see [place] at [facility]. I hope we have been able to provide you high quality care today.  You may receive a patient satisfaction survey over the next few weeks. We would appreciate your feedback and comments so that we may continue to improve ourselves and the health of our patients.

## 2022-09-19 NOTE — Progress Notes (Signed)
PATIENT: Dustin Pace DOB: Aug 29, 2000  REASON FOR VISIT: follow up HISTORY FROM: patient PRIMARY NEUROLOGIST: Dr. Vickey Huger  Chief Complaint  Patient presents with   Follow-up    Rm 19, alone. Doing well, no concerns.       HISTORY OF PRESENT ILLNESS: Today 09/19/22  Dustin Pace is a 22 y.o. male who has been followed in this office for Narcolepsy . Returns today for follow-up.  He reports that Xyrem is working well for him.  No trouble staying awake while driving or at work.  Denies any cataplectic events.  Returns today for evaluation   HISTORY 03/07/22   Dustin Pace is a 22 year old male with a history of narcolepsy.  He returns today for follow-up.  Reports that Xyrem continues to work well for him.  He states that he no longer falls asleep while driving.  He is able to get to work early in the morning.  States that he is now playing sports because he has more energy.  Overall he is feeling well.  Returns today for evaluation.   02/04/20: Dustin Pace is a 22 year old male with a history of narcolepsy.  He returns today for follow-up.  He is currently taking Xyrem and tolerating it well.  He states that he does not fall asleep while at work or at school.  Denies any trouble driving.  Denies depression or thoughts of suicide.  Patient mentions that last week he had a headache behind his left eye that radiated to the back of the head.  He states that he will get flashes in his vision.  He had this several times last week.  Does not have a diagnosis of migraine headaches.  Reports that this week he has not had any issues.  I advised the patient that if he develops the symptoms again he may need a formal work-up for migraine headaches.  He returns today for an evaluation.   HISTORY (Copied from Dr.Dohmeier's note) Dustin Pace is a 22 y.o. male , seen here as in a referral  from Counselor Gibbons  to be evaluated for narcolepsy.    [name] is seen here today in the  presence of his mother, he has been referred by his counselor, and has been treated for ADHD for about a year now.  The patient also mentioned to her that he has been excessively daytime sleepy and this started during high school.  He was not and excessively sleepy toddler, elementary school student and even in middle school seems not to have had trouble with sleep attacks or sudden irresistible urges to go to sleep.  As part of symptom screening he underwent an Epworth sleepiness score examination and scored 15 points.  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   He recalls isolated events where he may have lost muscle tone in response to emotional upset, anger, startle or a deep black cough.  He does not feel that this has been impairing his ability to expose himself in social situations etc. it is not a frequent or anticipated phenomenon for him.     In today's questionnaires he scored 48 points on the fatigue severity score which is high and the Epworth sleepiness score was once again endorsed at 15 points.  [name] also has noted that his sleepiness persists if he is on ADHD medication or not. He was on Vyvanse which increased his  BP and soon after had a physical  Evaluation, he was found to a PFO- chest pains increased. 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 Intunef is his  most recent ADHD non stimulant medication and he remains EDS ( excessively daytime sleepy).   His counselor is concerned that he is at risk of failing the 12th grade due to excessive absences in the morning.  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  For this reason we need now to evaluate him for the possibility of a sleep disorder. He is on Prozac- for anxiety and depression, and for suicidal  thoughts. Not an easy decision-  Weaning him off could expose him to severe risks.  he started on that medication 18 month ago. All if it started around the same time.        Chief complaint according to patient : " I am sleepy- sleepy"   Sleep habits are as follows: Midnight is bedtime , no trouble to fall asleep.  He sleeps alone in his room,with his dog, on two pillows and  in any sleep position.  He has dreams of being robbed , being chased, and very lucid and very real. Not every night. Does not report consecutive sleep.  Bedroom is cool , quiet and dark.  No nocturia. He wakes up frequently , is not sure if dream related. He has to rise at 9 AM - but stays until 9.20 AM and has difficulties to get up. Often late to school.  Wakes non refreshed, sleepy, dry mouth, almost daily headaches. Not nausea - no vision changes, no photophobia.    Sleep medical history and family sleep history:  Mother has EDS. No diagnosis yet. Tonsillectomy at age 43. Sleep walker in childhood, with night terrors, not recently.    Social history: lives with mother , and brother ( on weekends) , mother's fiancee and his daughter.  No tobacco use, no pot, No caffeine : not a caffeine drinker, he drinks sodas ( 40- 60 ounces Fr. Pepper each day monster drinks , (Taurin containing).  REVIEW OF SYSTEMS: Out of a complete 14 system review of symptoms, the patient complains only of the following symptoms, and all other reviewed systems are negative.  ALLERGIES: Allergies  Allergen Reactions   Nuvigil [Armodafinil] Other (See Comments)    Suicidal ideations   Other     Stimulants  - increase HR and BP     HOME MEDICATIONS: Outpatient Medications Prior to Visit  Medication Sig Dispense Refill   Sodium Oxybate (XYREM) 500 MG/ML SOLN 3 G twice nightly (pt is taking 3 G twice nightly) 540 mL 5   No facility-administered medications prior to visit.    PAST MEDICAL HISTORY: Past Medical History:  Diagnosis Date    Anxiety    Depression    Headache(784.0)    Narcolepsy    Pectus excavatum    Syncope and collapse     PAST SURGICAL HISTORY: Past Surgical History:  Procedure Laterality Date   TONSILLECTOMY AND ADENOIDECTOMY N/A 07/26/2012   Procedure: TONSILLECTOMY AND ADENOIDECTOMY;  Surgeon: Osborn Coho, MD;  Location: [facility];  Service: ENT;  Laterality: N/A;    FAMILY HISTORY: Family History  Problem Relation Age of Onset   Anxiety disorder Mother    Thyroid disease Mother    Depression Mother    Other Maternal Grandfather  SOCIAL HISTORY: Social History   Socioeconomic History   Marital status: Single    Spouse name: Not on file   Number of children: 0   Years of education: Not on file   Highest education level: Not on file  Occupational History   Not on file  Tobacco Use   Smoking status: Never    Passive exposure: Yes   Smokeless tobacco: Never  Vaping Use   Vaping Use: Never used  Substance and Sexual Activity   Alcohol use: No   Drug use: No   Sexual activity: Never  Other Topics Concern   Not on file  Social History Narrative   Not on file   Social Determinants of Health   Financial Resource Strain: Not on file  Food Insecurity: Not on file  Transportation Needs: Not on file  Physical Activity: Not on file  Stress: Not on file  Social Connections: Not on file  Intimate Partner Violence: Not on file      PHYSICAL EXAM  Vitals:   09/19/22 0755  BP: 122/75  Pulse: (!) 56  Weight: 175 lb 3.2 oz (79.5 kg)  Height: 5\' 11"  (1.803 m)   Body mass index is 24.44 kg/m.  Generalized: Well developed, in no acute distress   Neurological examination  Mentation: Alert oriented to time, place, history taking. Follows all commands speech and language fluent Cranial nerve II-XII: Pupils were equal round reactive to light. Extraocular movements were full, visual field were full on confrontational test. Facial sensation and strength  were normal. Head turning and shoulder shrug  were normal and symmetric. Motor: The motor testing reveals 5 over 5 strength of all 4 extremities. Good symmetric motor tone is noted throughout.  Sensory: Sensory testing is intact to soft touch on all 4 extremities. No evidence of extinction is noted.  Coordination: Cerebellar testing reveals good finger-nose-finger and heel-to-shin bilaterally.  Gait and station: Gait is normal.   DIAGNOSTIC DATA (LABS, IMAGING, TESTING) - I reviewed patient records, labs, notes, testing and imaging myself where available.  Lab Results  Component Value Date   WBC 5.6 07/22/2020   HGB 14.7 07/22/2020   HCT 42.7 07/22/2020   MCV 90 07/22/2020   PLT 186 07/22/2020      Component Value Date/Time   NA 145 (H) 07/22/2020 1505   K 4.5 07/22/2020 1505   CL 101 07/22/2020 1505   CO2 28 07/22/2020 1505   GLUCOSE 72 07/22/2020 1505   GLUCOSE 84 11/17/2016 2003   BUN 11 07/22/2020 1505   CREATININE 0.93 07/22/2020 1505   CALCIUM 10.3 (H) 07/22/2020 1505   PROT 7.5 07/22/2020 1505   ALBUMIN 5.1 07/22/2020 1505   AST 14 07/22/2020 1505   ALT 14 07/22/2020 1505   ALKPHOS 51 07/22/2020 1505   BILITOT 0.8 07/22/2020 1505   GFRNONAA 109 10/21/2019 0913   GFRAA 126 10/21/2019 0913    Lab Results  Component Value Date   TSH 0.955 01/22/2019      ASSESSMENT AND PLAN 22 y.o. year old male  has a past medical history of Anxiety, Depression, Headache(784.0), Narcolepsy, Pectus excavatum, and Syncope and collapse. here with:  Narcolepsy  -Continue Xyrem -Check blood work today CMP -Follow-up in 6 months or sooner if needed     Butch Penny, MSN, NP-C 09/19/2022, 8:06 AM [facility] [address], [address] [place], [place] [address] [phone]

## 2022-09-20 LAB — COMPREHENSIVE METABOLIC PANEL
ALT: 12 IU/L (ref 0–44)
AST: 16 IU/L (ref 0–40)
Albumin/Globulin Ratio: 2.1 (ref 1.2–2.2)
Albumin: 4.8 g/dL (ref 4.3–5.2)
Alkaline Phosphatase: 50 IU/L (ref 44–121)
BUN/Creatinine Ratio: 10 (ref 9–20)
BUN: 10 mg/dL (ref 6–20)
Bilirubin Total: 0.8 mg/dL (ref 0.0–1.2)
CO2: 27 mmol/L (ref 20–29)
Calcium: 9.5 mg/dL (ref 8.7–10.2)
Chloride: 102 mmol/L (ref 96–106)
Creatinine, Ser: 1.02 mg/dL (ref 0.76–1.27)
Globulin, Total: 2.3 g/dL (ref 1.5–4.5)
Glucose: 102 mg/dL — ABNORMAL HIGH (ref 70–99)
Potassium: 4.4 mmol/L (ref 3.5–5.2)
Sodium: 142 mmol/L (ref 134–144)
Total Protein: 7.1 g/dL (ref 6.0–8.5)
eGFR: 107 mL/min/{1.73_m2} (ref >59)

## 2022-10-02 ENCOUNTER — Ambulatory Visit: Payer: BC Managed Care – PPO | Admitting: Adult Health

## 2022-10-11 ENCOUNTER — Telehealth: Payer: Self-pay | Admitting: *Deleted

## 2022-10-11 NOTE — Telephone Encounter (Signed)
Received fax from [employer] asking if we have any knowledge of adverse event from pt being on sodium oxybate. We are unaware of any issues. I called the pt & LVM (ok per DPR) to confirm. I asked for call back or mychart message. As of pt's last visit on 09/19/22 he was doing fine on xyrem.   Form faxed back to [facility]. Received a receipt of confirmation.

## 2022-10-12 ENCOUNTER — Telehealth: Payer: Self-pay | Admitting: *Deleted

## 2022-10-12 NOTE — Telephone Encounter (Signed)
Received fax needing new prescription for sodium oxybate. Completed prescription and fax with confirmation received.

## 2022-10-18 NOTE — Telephone Encounter (Signed)
Received call from [facility] w/ [facility] requesting clarification on quantity and days supply of the Xyrem. I advised to fill a quantity of 1 month supply with 5 refills to [facility], pharmacist.

## 2023-03-30 ENCOUNTER — Telehealth: Payer: Self-pay | Admitting: Adult Health

## 2023-03-30 NOTE — Telephone Encounter (Signed)
Carlie from [facility] calling to request refill of Sodium Oxybate (XYREM) 500 MG/ML SOLN  Send to  [facility] - [place], [place] - [address]

## 2023-03-30 NOTE — Telephone Encounter (Addendum)
Prescription refill form completed and is pending Dr Oliva Bustard signature.   Last seen 10/09/2022 and next visit is 09/25/2023. Last refills were sent on 10/18/22.

## 2023-04-13 ENCOUNTER — Telehealth: Payer: Self-pay

## 2023-04-13 ENCOUNTER — Other Ambulatory Visit (HOSPITAL_COMMUNITY): Payer: Self-pay

## 2023-04-13 NOTE — Telephone Encounter (Signed)
   Received a PA request on CMM to do PA for Sodium Oxybate  500mg /ml solution-please advise-I answered to get the questions to populate.

## 2023-04-27 ENCOUNTER — Other Ambulatory Visit (HOSPITAL_COMMUNITY): Payer: Self-pay

## 2023-04-27 NOTE — Telephone Encounter (Signed)
 Following up on the previous insurance questions-please advise.

## 2023-04-27 NOTE — Telephone Encounter (Signed)
 Tried prozac and wellbutrin:

## 2023-05-01 NOTE — Telephone Encounter (Signed)
 Called and spoke to pt and he stated he has never tried Careers information officer. But that all antidepresents he's tried are prozac10mg , 20mg ,40mg , and wellbutrin 150mg . He stated that he isn't interestested in trying effexor or anything else for that matter.

## 2023-05-03 ENCOUNTER — Other Ambulatory Visit (HOSPITAL_COMMUNITY): Payer: Self-pay

## 2023-05-03 NOTE — Telephone Encounter (Signed)
Pharmacy Patient Advocate Encounter   Received notification from RX Request Messages that prior authorization for Sodium Oxybate 500MG /ML solution is required/requested.   Insurance verification completed.   The patient is insured through [facility] .   Per test claim: PA required; PA submitted to above mentioned insurance via CoverMyMeds Key/confirmation #/EOC [id number] Status is pending

## 2023-05-04 ENCOUNTER — Other Ambulatory Visit (HOSPITAL_COMMUNITY): Payer: Self-pay

## 2023-05-04 NOTE — Telephone Encounter (Signed)
Pharmacy Patient Advocate Encounter  Received notification from [facility] that Prior Authorization for Sodium Oxybate 500MG /ML solution has been APPROVED from 05/03/2023 to 05/02/2024   PA #/Case ID/Reference #: PA Case ID #: [id number]

## 2023-09-25 ENCOUNTER — Ambulatory Visit: Payer: BC Managed Care – PPO | Admitting: Adult Health

## 2023-09-25 ENCOUNTER — Encounter: Payer: Self-pay | Admitting: Adult Health

## 2023-09-25 VITALS — BP 128/67 | HR 69 | Ht 70.0 in | Wt 172.0 lb

## 2023-09-25 DIAGNOSIS — G47411 Narcolepsy with cataplexy: Secondary | ICD-10-CM | POA: Diagnosis not present

## 2023-09-25 NOTE — Patient Instructions (Signed)
 Your Plan:  Continue Xyrem       Thank you for coming to see us  at [facility]. I hope we have been able to provide you high quality care today.  You may receive a patient satisfaction survey over the next few weeks. We would appreciate your feedback and comments so that we may continue to improve ourselves and the health of our patients.

## 2023-09-25 NOTE — Progress Notes (Signed)
 PATIENT: Dustin Pace DOB: 03-02-2001  REASON FOR VISIT: follow up HISTORY FROM: patient PRIMARY NEUROLOGIST: Dr. Albertina Hugger  Chief Complaint  Patient presents with   Follow-up    Pt in 5      HISTORY OF PRESENT ILLNESS: Today 09/25/23  Dustin Pace is a 23 y.o. 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 Returns today for follow-up.  Overall he reports that he is doing well.  Xyrem  continues to work well for him.  No trouble staying awake during the day.  Denies cataplectic events.  No change in mood or behavior.  No suicidal thoughts.  Returns today for an evaluation.   09/19/22: Dustin Pace is a 23 y.o. male who has been followed in this office for Narcolepsy . Returns today for follow-up.  He reports that Xyrem  is working well for him.  No trouble staying awake while driving or at work.  Denies any cataplectic events.  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  He returns today for follow-up.  Reports that Xyrem  continues to work well for him.  He states that he no longer falls asleep while driving.  He is able to get to work early in the morning.  States that he is now playing sports because he has more energy.  Overall he is feeling well.  Returns today for evaluation.   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  He returns today for follow-up.  He is currently taking Xyrem  and tolerating it well.  He states that he does not fall asleep while at work or at school.  Denies any trouble driving.  Denies depression or thoughts of suicide.  Patient mentions that last week he had a headache behind his left eye that radiated to the back of the head.  He states that he will get flashes in his vision.  He had this several times last week.  Does not have a diagnosis of migraine headaches.  Reports that this week he has not had any issues.  I advised the patient that if he develops the symptoms  again he may need a formal work-up for migraine headaches.  He returns today for an evaluation.   HISTORY (Copied from Dr.Dohmeier's note) Dustin Pace is a 23 y.o. male , seen here as in a referral  from Counselor Gibbons  to be evaluated for narcolepsy.    Dustin Pace is seen here today in the presence of his mother, he has been referred by his counselor, and has been treated for ADHD for about a year now.  The patient also mentioned to her that he has been excessively daytime sleepy and this started during high school.  He was not and excessively sleepy toddler, elementary school student and even in middle school seems not to have had trouble with sleep attacks or sudden irresistible urges to go to sleep.  As part of symptom screening he underwent an Epworth sleepiness score examination and scored 15 points.  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   He recalls isolated events where he may have lost muscle tone in response to emotional upset, anger, startle or a deep black cough.  He does not feel that this has been impairing his ability to expose himself in social situations etc. it is not a frequent or anticipated  phenomenon for him.     In today's questionnaires he scored 48 points on the fatigue severity score which is high and the Epworth sleepiness score was once again endorsed at 15 points.  Dustin Pace also has noted that his sleepiness persists if he is on ADHD medication or not. 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 He also has a thorax abnormality- an indention in his chest-  Vyvanse  was replaced with Strattera . Intunef is his  most recent ADHD non stimulant medication and he remains EDS ( excessively daytime sleepy).   His counselor is concerned that he is at risk of failing the 12th grade due to excessive absences  in the morning.  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  For this reason we need now to evaluate him for the possibility of a sleep disorder. He is on Prozac - for anxiety and depression, and for suicidal thoughts. Not an easy decision-  Weaning him off could expose him to severe risks.  he started on that medication 18 month ago. All if it started around the same time.        Chief complaint according to patient : " I am sleepy- sleepy"   Sleep habits are as follows: Midnight is bedtime , no trouble to fall asleep.  He sleeps alone in his room,with his dog, on two pillows and  in any sleep position.  He has dreams of being robbed , being chased, and very lucid and very real. Not every night. Does not report consecutive sleep.  Bedroom is cool , quiet and dark.  No nocturia. He wakes up frequently , is not sure if dream related. He has to rise at 9 AM - but stays until 9.20 AM and has difficulties to get up. Often late to school.  Wakes non refreshed, sleepy, dry mouth, almost daily headaches. Not nausea - no vision changes, no photophobia.    Sleep medical history and family sleep history:  Mother has EDS. No diagnosis yet. Tonsillectomy at age 51. Sleep walker in childhood, with night terrors, not recently.    Social history: lives with mother , and brother ( on weekends) , mother's fiancee and his daughter.  No tobacco use, no pot, No caffeine : not a caffeine drinker, he drinks sodas ( 40- 60 ounces Fr. Pepper each day monster drinks , (Taurin containing).  REVIEW OF SYSTEMS: Out of a complete 14 system review of symptoms, the patient complains only of the following symptoms, and all other reviewed systems are negative.  ALLERGIES: Allergies  Allergen Reactions   Nuvigil  [Armodafinil ] Other (See Comments)    Suicidal ideations   Other     Stimulants  - increase HR and BP     HOME  MEDICATIONS: Outpatient Medications Prior to Visit  Medication Sig Dispense Refill   Sodium Oxybate  (XYREM ) 500 MG/ML SOLN 3 G twice nightly (pt is taking 3 G twice nightly) 540 mL 5   No facility-administered medications prior to visit.    PAST MEDICAL HISTORY: Past Medical History:  Diagnosis Date   Anxiety    Depression    Headache(784.0)    Narcolepsy    Pectus excavatum    Syncope and collapse     PAST SURGICAL HISTORY: Past Surgical History:  Procedure Laterality Date   TONSILLECTOMY AND ADENOIDECTOMY N/A 07/26/2012  Procedure: TONSILLECTOMY AND ADENOIDECTOMY;  Surgeon: Ammon Bales, MD;  Location: [facility];  Service: ENT;  Laterality: N/A;    FAMILY HISTORY: Family History  Problem Relation Age of Onset   Anxiety disorder Mother    Thyroid  disease Mother    Depression Mother    Other Maternal Grandfather     SOCIAL HISTORY: Social History   Socioeconomic History   Marital status: Single    Spouse name: Not on file   Number of children: 0   Years of education: Not on file   Highest education level: Not on file  Occupational History   Not on file  Tobacco Use   Smoking status: Never    Passive exposure: Yes   Smokeless tobacco: Never  Vaping Use   Vaping status: Never Used  Substance and Sexual Activity   Alcohol use: No   Drug use: No   Sexual activity: Never  Other Topics Concern   Not on file  Social History Narrative   Not on file   Social Drivers of Health   Financial Resource Strain: Low Risk  (09/11/2023)   Received from [facility]   Overall Financial Resource Strain (CARDIA)    Difficulty of Paying Living Expenses: Not hard at all  Food Insecurity: No Food Insecurity (09/11/2023)   Received from [facility]   Hunger Vital Sign    Worried About Running Out of Food in the Last Year: Never true    Ran Out of Food in the Last Year: Never true  Transportation Needs: No Transportation Needs (09/11/2023)   Received  from [facility] - Transportation    Lack of Transportation (Medical): No    Lack of Transportation (Non-Medical): No  Physical Activity: Not on file  Stress: Not on file  Social Connections: Not on file  Intimate Partner Violence: Not on file      PHYSICAL EXAM  Vitals:   09/25/23 0836  BP: 128/67  Pulse: 69  Weight: 172 lb (78 kg)  Height: 5\' 10"  (1.778 m)    Body mass index is 24.68 kg/m.  Generalized: Well developed, in no acute distress   Neurological examination  Mentation: Alert oriented to time, place, history taking. Follows all commands speech and language fluent Cranial nerve II-XII: Pupils were equal round reactive to light. Extraocular movements were full, visual field were full on confrontational test. Facial sensation and strength were normal. Head turning and shoulder shrug  were normal and symmetric. Motor: The motor testing reveals 5 over 5 strength of all 4 extremities. Good symmetric motor tone is noted throughout.  Sensory: Sensory testing is intact to soft touch on all 4 extremities. No evidence of extinction is noted.  Coordination: Cerebellar testing reveals good finger-nose-finger and heel-to-shin bilaterally.  Gait and station: Gait is normal.   DIAGNOSTIC DATA (LABS, IMAGING, TESTING) - I reviewed patient records, labs, notes, testing and imaging myself where available.  Lab Results  Component Value Date   WBC 5.6 07/22/2020   HGB 14.7 07/22/2020   HCT 42.7 07/22/2020   MCV 90 07/22/2020   PLT 186 07/22/2020      Component Value Date/Time   NA 142 09/19/2022 0824   K 4.4 09/19/2022 0824   CL 102 09/19/2022 0824   CO2 27 09/19/2022 0824   GLUCOSE 102 (H) 09/19/2022 0824   GLUCOSE 84 11/17/2016 2003   BUN 10 09/19/2022 0824   CREATININE 1.02 09/19/2022 0824   CALCIUM 9.5 09/19/2022 0824  PROT 7.1 09/19/2022 0824   ALBUMIN 4.8 09/19/2022 0824   AST 16 09/19/2022 0824   ALT 12 09/19/2022 0824   ALKPHOS 50 09/19/2022 0824    BILITOT 0.8 09/19/2022 0824   GFRNONAA 109 10/21/2019 0913   GFRAA 126 10/21/2019 0913    Lab Results  Component Value Date   TSH 0.955 01/22/2019      ASSESSMENT AND PLAN 23 y.o. year old male  has a past medical history of Anxiety, Depression, Headache(784.0), Narcolepsy, Pectus excavatum, and Syncope and collapse. here with:  Narcolepsy  -Continue Xyrem  -Check blood work today CMP -Follow-up in 6 months or sooner if needed   The patient's condition requires frequent monitoring and adjustments in the treatment plan, reflecting the ongoing complexity of care.  This provider is the continuing focal point for all needed services for this condition.  Clem Currier, MSN, NP-C 09/25/2023, 8:31 AM [facility] [address] [place], [place] [address] [phone]

## 2023-09-26 ENCOUNTER — Ambulatory Visit: Payer: Self-pay | Admitting: Adult Health

## 2023-09-26 LAB — COMPREHENSIVE METABOLIC PANEL WITH GFR
ALT: 15 IU/L (ref 0–44)
AST: 16 IU/L (ref 0–40)
Albumin: 5.2 g/dL (ref 4.3–5.2)
Alkaline Phosphatase: 60 IU/L (ref 44–121)
BUN/Creatinine Ratio: 9 (ref 9–20)
BUN: 10 mg/dL (ref 6–20)
Bilirubin Total: 1.1 mg/dL (ref 0.0–1.2)
CO2: 24 mmol/L (ref 20–29)
Calcium: 10.2 mg/dL (ref 8.7–10.2)
Chloride: 98 mmol/L (ref 96–106)
Creatinine, Ser: 1.12 mg/dL (ref 0.76–1.27)
Globulin, Total: 2.2 g/dL (ref 1.5–4.5)
Glucose: 89 mg/dL (ref 70–99)
Potassium: 4.3 mmol/L (ref 3.5–5.2)
Sodium: 140 mmol/L (ref 134–144)
Total Protein: 7.4 g/dL (ref 6.0–8.5)
eGFR: 95 mL/min/{1.73_m2} (ref >59)

## 2024-02-12 ENCOUNTER — Telehealth: Payer: Self-pay | Admitting: *Deleted

## 2024-02-12 NOTE — Telephone Encounter (Signed)
 [facility] pharmacy called to confirm date on Xyrem  Rx and that substitution is allowed. Confirmed date 02/06/24, can fill sodium oxybate  as they have been. Spoke with Braden, pharmacist.

## 2024-03-26 ENCOUNTER — Ambulatory Visit: Admitting: Neurology

## 2024-03-26 ENCOUNTER — Encounter: Payer: Self-pay | Admitting: Neurology

## 2024-03-26 ENCOUNTER — Other Ambulatory Visit: Payer: Self-pay

## 2024-03-26 ENCOUNTER — Telehealth: Payer: Self-pay | Admitting: *Deleted

## 2024-03-26 VITALS — BP 126/82 | HR 79 | Ht 70.0 in | Wt 174.0 lb

## 2024-03-26 DIAGNOSIS — Z79899 Other long term (current) drug therapy: Secondary | ICD-10-CM | POA: Diagnosis not present

## 2024-03-26 DIAGNOSIS — G47411 Narcolepsy with cataplexy: Secondary | ICD-10-CM

## 2024-03-26 MED ORDER — LUMRYZ 6 G PO PACK: 6.0000 g | PACK | Freq: Every day | ORAL | 5 refills | Status: AC

## 2024-03-26 NOTE — Addendum Note (Signed)
 Addended by: CHALICE SAUNAS on: 03/26/2024 10:57 AM   Modules accepted: Orders

## 2024-03-26 NOTE — Telephone Encounter (Signed)
 [facility]   rems and prescription forms have been signed by Dr Chalice. Forms faxed to [facility] . Received a receipt of confirmation.

## 2024-03-26 NOTE — Telephone Encounter (Signed)
 Pt signed the enrollment form. Both enrollment form and prescription form (6 grams nightly) are complete and have been placed in Dr Dohmeier's office for review and signature.   The fax # to the [facility] program is [phone]

## 2024-03-26 NOTE — Telephone Encounter (Signed)
 Patient currently filling out [facility]  enrollment form. Will turn in at checkout before leaving visit today. Patient was given a patient information guide regarding [facility] . Prescription form also needing completion before submitting to [facility] .

## 2024-03-26 NOTE — Patient Instructions (Signed)
 Sodium Oxybate  Extended-Release Suspension What is this medication? SODIUM OXYBATE  (SOE dee um OX i bate) treats narcolepsy. It works by decreasing daytime sleepiness, which can help you sleep better at night. It also reduces the number of episodes of sudden muscle weakness (cataplexy). This medicine may be used for other purposes; ask your health care provider or pharmacist if you have questions. COMMON BRAND NAME(S): [facility]  What should I tell my care team before I take this medication? They need to know if you have any of these conditions: Depression Diet low in salt Frequently drink alcohol Heart disease High blood pressure History of substance use disorder Kidney disease Liver disease Lung or breathing disease, such as asthma or COPD Mental health conditions Sleep apnea Succinic semialdehyde dehydrogenase deficiency Suicidal thoughts, plans, or attempt An unusual or allergic reaction to oxybate, other medications, foods, dyes, or preservatives Pregnant or trying to get pregnant Breastfeeding How should I use this medication? Take this medication by mouth. Take it once daily at bedtime, while you are in bed. Lie down right away after taking the dose and remain in bed. Take it on an empty stomach, at least 2 hours after food. Keep taking it unless your care team tells you to stop. Mix the contents of the packet with water in the provided cup. Shake for at least 60 seconds. Drink the solution within 30 minutes of mixing. Refill the cup with water to mix any medication left in the cup. Mix again for 10 seconds and drink the solution. A special MedGuide will be given to you by the pharmacist with each prescription and refill. Be sure to read this information carefully each time. This medication comes with INSTRUCTIONS FOR USE. Ask your pharmacist for directions on how to use this medication. Read the information carefully. Talk to your pharmacist or care team if you have questions. Talk to  your care team about the use of this medication in children. While it may be prescribed for children as young as 7 years for selected conditions, precautions do apply. Overdosage: If you think you have taken too much of this medicine contact a poison control center or emergency room at once. NOTE: This medicine is only for you. Do not share this medicine with others. What if I miss a dose? If you miss a dose, skip it. Take your next dose at the normal time. Do not take extra or 2 doses at the same time to make up for the missed dose. What may interact with this medication? Do not take this medication with any of the following: Alcohol Medications that help you fall asleep This medication may also interact with the following: Benzodiazepines, such as alprazolam, diazepam, or lorazepam Certain antihistamines Certain medications for depression, such as amitriptyline, fluoxetine , sertraline, or trazodone Certain medications for seizures, such as divalproex sodium, phenobarbital, primidone, valproic acid Medications that cause drowsiness before a procedure, such as propofol  Medications that relax muscles Opioids for pain or cough Phenothiazines, such as chlorpromazine, prochlorperazine, thioridazine This list may not describe all possible interactions. Give your health care provider a list of all the medicines, herbs, non-prescription drugs, or dietary supplements you use. Also tell them if you smoke, drink alcohol, or use illegal drugs. Some items may interact with your medicine. What should I watch for while using this medication? Visit your care team for regular checks on your progress. Tell your care team if your symptoms do not start to get better or if they get worse. This medication has a  risk of abuse and dependence. Your care team will check you for this while you take this medication. This medication may affect your coordination, reaction time, or judgment. Do not engage in activities that  require mental alertness, such as driving or operating machinery, for at least 6 hours after taking this medication. You may fall asleep quickly after taking this medication. Remain in bed and do not attempt to sit or stand up. Drinking alcohol with this medication can increase the risk of these side effects. You may do unusual sleep behaviors or activities you do not remember the day after taking this medication. Activities include driving, making or eating food, talking on the phone, sexual activity, or sleep walking. Stop taking this medication and call your care team right away if you find out you have done activities like this. This medication may cause thoughts of suicide or depression. This includes sudden changes in mood, behaviors, or thoughts. These changes can happen at any time but are more common in the beginning of treatment or after a change in dose. Call your care team right away if you experience these thoughts or worsening depression. What side effects may I notice from receiving this medication? Side effects that you should report to your care team as soon as possible: Allergic reactions--skin rash, itching, hives, swelling of the face, lips, tongue, or throat CNS depression--slow or shallow breathing, shortness of breath, feeling faint, dizziness, confusion, trouble staying awake Mood and behavior changes--anxiety, nervousness, confusion, hallucinations, irritability, hostility, thoughts of suicide or self-harm, worsening mood, feelings of depression Sleep apnea--loud snoring, gasping or choking during sleep, daytime sleepiness Sleepwalking Side effects that usually do not require medical attention (report these to your care team if they continue or are bothersome): Bedwetting Dizziness Headache Loss of appetite Nausea Vomiting This list may not describe all possible side effects. Call your doctor for medical advice about side effects. You may report side effects to FDA at  1-800-FDA-1088. Where should I keep my medication? Keep out of the reach of children and pets. This medication can be abused. Keep it in a safe place to protect it from theft. Do not share it with anyone. It is only for you. Selling or giving away this medication is dangerous and against the law. Store at room temperature between 20 and 25 degrees C (68 and 77 degrees F). After mixing the powder with water, it should be taken within 30 minutes. Get rid of any unused medication after the expiration date. This medication may cause harm and death if it is taken by other adults, children, or pets. It is important to get rid of the medication as soon as you no longer need it or it is expired. You can do this in two ways: Take the medication to a medication take-back program. Check with your pharmacy or law enforcement to find a location. If you cannot return the medication, flush it down the toilet. NOTE: This sheet is a summary. It may not cover all possible information. If you have questions about this medicine, talk to your doctor, pharmacist, or health care provider.  2024 Elsevier/Gold Standard (2023-02-06 00:00:00)

## 2024-03-26 NOTE — Progress Notes (Addendum)
 Provider:  Dedra Gores, MD  Primary Care Physician:     Referring Provider: none         Chief Complaint according to patient   Patient presents with:                HISTORY OF PRESENT ILLNESS:  Dustin Pace is a 23 y.o. male patient who is here for revisit 03/26/2024 for sleep follow up, uses XYREM  for narcolepsy .    Chief concern according to patient :  I may want to go to the newer meds with once a day dosage , Lumeryz. I am also interested in ADHD treatment.   We discussed the benefits of once at night dosing , the benefits of a non-liquid form of medication, no need for refrigeration, easier airline travel.   We will start on step 3 of the titration . No wash- out  period.     Today 09/25/23   Dustin Pace is a 23 y.o. 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 Returns today for follow-up.  Overall he reports that he is doing well.  Xyrem  continues to work well for him.  No trouble staying awake during the day.  Denies cataplectic events.  No change in mood or behavior.  No suicidal thoughts.  Returns today for an evaluation.     09/19/22: Dustin Pace is a 23 y.o. male who has been followed in this office for Narcolepsy . Returns today for follow-up.  He reports that Xyrem  is working well for him.  No trouble staying awake while driving or at work.  Denies any cataplectic events.  Returns today for evaluation   03/07/22  Dustin Pace is a 23 year old male with a history of narcolepsy.  He returns today for follow-up.  Reports that Xyrem  continues to work well for him.  He states that he no longer falls asleep while driving.  He is able to get to work early in the morning.  States that he is now playing sports because he has more energy.  Overall he is feeling well.  Returns today for evaluation.   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  He returns today for follow-up.  He is currently taking Xyrem  and tolerating it  well.  He states that he does not fall asleep while at work or at school.  Denies any trouble driving.  Denies depression or thoughts of suicide.  Patient mentions that last week he had a headache behind his left eye that radiated to the back of the head.  He states that he will get flashes in his vision.  He had this several times last week.  Does not have a diagnosis of migraine headaches.  Reports that this week he has not had any issues.  I advised the patient that if he develops the symptoms again he may need a formal work-up for migraine headaches.  He returns today for an evaluation.   HISTORY (Copied from Dr.Areliz Rothman's note) Dustin Pace is a 23 y.o. male , seen here as in a referral  from Counselor Gibbons  to be evaluated for narcolepsy.    Mr. Bills is seen here today in the presence of his mother, he has been referred by his counselor, and has been treated for ADHD for about a year now.   The patient also mentioned to her that he has been excessively daytime sleepy and this started during  high school.  He was not and excessively sleepy toddler, elementary school student and even in middle school seems not to have had trouble with sleep attacks or sudden irresistible urges to go to sleep.  As part of symptom screening he underwent an Epworth sleepiness score examination and scored 15 points.  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   He recalls isolated events where he may have lost muscle tone in response to emotional upset, anger, startle or a deep black cough.  He does not feel that this has been impairing his ability to expose himself in social situations etc. it is not a frequent or anticipated phenomenon for him.     In today's questionnaires he scored 48 points on the fatigue severity score which is high and the Epworth sleepiness score was once again endorsed at 15 points.   Stanely also has  noted that his sleepiness persists if he is on ADHD medication or not. He was on Vyvanse  which increased his BP and soon after had a physical     Review of Systems: Out of a complete 14 system review, the patient complains of only the following symptoms, and all other reviewed systems are negative.:   SLEEPINESS ?  How likely are you to doze in the following situations: 0 = not likely, 1 = slight chance, 2 = moderate chance, 3 = high chance  Sitting and Reading? Watching Television? Sitting inactive in a public place (theater or meeting)? Lying down in the afternoon when circumstances permit? Sitting and talking to someone? Sitting quietly after lunch without alcohol? In a car, while stopped for a few minutes in traffic? As a passenger in a car for an hour without a break?  Total =  5/ 24      Social History   Socioeconomic History   Marital status: Single    Spouse name: Not on file   Number of children: 0   Years of education: Not on file   Highest education level: Not on file  Occupational History   Not on file  Tobacco Use   Smoking status: Never    Passive exposure: Yes   Smokeless tobacco: Never  Vaping Use   Vaping status: Never Used  Substance and Sexual Activity   Alcohol use: No   Drug use: No   Sexual activity: Never  Other Topics Concern   Not on file  Social History Narrative   Lives alone    Works    Social Drivers of Corporate Investment Banker Strain: Low Risk (09/11/2023)   Received from [facility]   Overall Financial Resource Strain (CARDIA)    Difficulty of Paying Living Expenses: Not hard at all  Food Insecurity: No Food Insecurity (09/11/2023)   [facility]   Hunger Vital Sign    Within the past 12 months, you worried that your food would run out before you got the money to buy more.: Never true    Within the past 12 months, the food you bought just didn't last and you didn't have money to get more.: Never true   Transportation Needs: No Transportation Needs (09/11/2023)   Received from [facility]    Lack of Transportation (Medical): No    Lack of Transportation (Non-Medical): No  Physical Activity: Not on file  Stress: Not on file  Social Connections: Not on file  Family History  Problem Relation Age of Onset   Anxiety disorder Mother    Thyroid  disease Mother    Depression Mother    Other Maternal Grandfather    Narcolepsy Neg Hx     Past Medical History:  Diagnosis Date   Anxiety    Depression    Headache(784.0)    Narcolepsy    Pectus excavatum    Syncope and collapse     Past Surgical History:  Procedure Laterality Date   TONSILLECTOMY AND ADENOIDECTOMY N/A 07/26/2012   Procedure: TONSILLECTOMY AND ADENOIDECTOMY;  Surgeon: Alm Bouche, MD;  Location: [facility];  Service: ENT;  Laterality: N/A;     Current Outpatient Medications on File Prior to Visit  Medication Sig Dispense Refill   Sodium Oxybate  (XYREM ) 500 MG/ML SOLN 3 G twice nightly (pt is taking 3 G twice nightly) 540 mL 5   No current facility-administered medications on file prior to visit.    Allergies  Allergen Reactions   Nuvigil  [Armodafinil ] Other (See Comments)    Suicidal ideations     DIAGNOSTIC DATA (LABS, IMAGING, TESTING) - I reviewed patient records, labs, notes, testing and imaging myself where available.  Lab Results  Component Value Date   WBC 5.6 07/22/2020   HGB 14.7 07/22/2020   HCT 42.7 07/22/2020   MCV 90 07/22/2020   PLT 186 07/22/2020      Component Value Date/Time   NA 140 09/25/2023 0847   K 4.3 09/25/2023 0847   CL 98 09/25/2023 0847   CO2 24 09/25/2023 0847   GLUCOSE 89 09/25/2023 0847   GLUCOSE 84 11/17/2016 2003   BUN 10 09/25/2023 0847   CREATININE 1.12 09/25/2023 0847   CALCIUM 10.2 09/25/2023 0847   PROT 7.4 09/25/2023 0847   ALBUMIN 5.2 09/25/2023 0847   AST 16 09/25/2023 0847   ALT 15 09/25/2023 0847    ALKPHOS 60 09/25/2023 0847   BILITOT 1.1 09/25/2023 0847   GFRNONAA 109 10/21/2019 0913   GFRAA 126 10/21/2019 0913   No results found for: CHOL, HDL, LDLCALC, LDLDIRECT, TRIG, CHOLHDL No results found for: YHAJ8R No results found for: VITAMINB12 Lab Results  Component Value Date   TSH 0.955 01/22/2019    PHYSICAL EXAM:  Vitals:   03/26/24 0929  BP: 126/82  Pulse: 79   No data found. Body mass index is 24.97 kg/m.   Wt Readings from Last 3 Encounters:  03/26/24 174 lb (78.9 kg)  09/25/23 172 lb (78 kg)  09/19/22 175 lb 3.2 oz (79.5 kg)     Ht Readings from Last 3 Encounters:  03/26/24 5' 10 (1.778 m)  09/25/23 5' 10 (1.778 m)  09/19/22 5' 11 (1.803 m)      General: The patient is awake, alert and appears not in acute distress and groomed. Head: Normocephalic, atraumatic.  Neck is supple.  Mallampati ,  neck circumference:16 inches .   Nasal airflow is patent.   Overbite  is seen seen.  Dental status:  biological  Cardiovascular:  Regular rate and cardiac rhythm by pulse, without distended neck veins. Respiratory: no shortness of breath  Skin:  Without evidence of ankle edema, or rash. Trunk: BMI is 25    NEUROLOGIC EXAM: The patient is awake and alert, oriented to place and time.   Memory subjective described as intact.  Attention span & concentration ability appears normal.   Speech is fluent,  without  dysarthria, dysphonia or aphasia.  Mood and affect are appropriate.  Well developed, in no acute distress ,   Neurological examination  Mentation: Alert oriented to time, place, history taking. Follows all commands speech and language fluent Cranial nerve : Pupils were equal round reactive to light. Extraocular movements were full, visual field were full on confrontational test.   Head turning and shoulder shrug  were normal and symmetric. Motor: full strength of all 4 extremities with symmetric motor tone.  Coordination: no  changes in penmanship  Gait and station: Gait is intact  Reflexes: Deep tendon reflexes are symmetric .    ASSESSMENT AND PLAN :   23 y.o. year old male  here with:    1) Narcolepsy with Cataplexy, used to have sleep attacks and almost failed 12 th grade when we originally met-( and still has ADHD, causing procrastination and multitasking problems).   For 5 years on XYREM   3 gram twice nightly > Epworth went from 15 to 5 points. He is interested in  switching to [employer].   6 gram Lumeryz ordered , 30 sachets and 5 refills.   2) CMET today, CBC ordered. I would appreciate to alternate with his PCP at [facility] for q 6 months re testing, to assure his liver function is normal on the medications prescribed here.   3) referral to attention specialist for ADHD medications.   As discussed, Lumeryz has to be taken with very mindful caution: Taking Lumeryz correctly is key. This means, take it only when you are fully ready to fall asleep, while in bed and refrain from doing any other activities, even brushing  your teeth after taking your .  You can go to the bathroom before your dose. Take your dose when actually IN BED, ready to sleep. NO using the cell phone or computer, NO getting up to use the bathroom. Take care of everything BEFORE sleep time. Do not drink alcohol with Lumeryz.  If you do drink Alcohol, you cannot take your medication dose that night.    6 months follow up with Np  Dedra Gores, MD      Sleep Clinic Patients are generally offered input on sleep hygiene, life style changes and how to improve compliance with medical treatment where applicable. Review and reiteration of good sleep hygiene measures is offered to any sleep clinic patient, be it in the first consultation or with any follow up visits.    Any patient with sleepiness should be cautioned not to drive, work at heights, or operate dangerous or heavy equipment when feeling tired or sleepy.      The patient  will be seen in follow-up in the sleep clinic at [facility] for discussion of test results, sleep related symptoms and treatment compliance review, further management strategies, etc.   The referring provider will be notified of the test results.   The patient's condition requires frequent monitoring and adjustments in the treatment plan, reflecting the ongoing complexity of care.  This provider is the continuing focal point for all needed services for this condition.  After spending a total time of  45  minutes face to face and time for  history taking, physical and neurologic examination, review of laboratory studies,  personal review of imaging studies, reports and results of other testing and review of referral information / records as far as provided in visit,   Electronically signed by: Dedra Gores, MD 03/26/2024 9:58 AM  [facility] Board certified by The [employer] of Sleep Medicine and Diplomate of the [employer]  of Sleep Medicine. Board certified In Neurology through the ABPN, Fellow of the [employer] of Neurology.

## 2024-03-26 NOTE — Addendum Note (Signed)
 Addended by: ROBBERT GOSLING D on: 03/26/2024 11:03 AM   Modules accepted: Orders

## 2024-03-27 LAB — COMPREHENSIVE METABOLIC PANEL WITH GFR
ALT: 18 IU/L (ref 0–44)
AST: 22 IU/L (ref 0–40)
Albumin: 5.4 g/dL — ABNORMAL HIGH (ref 4.3–5.2)
Alkaline Phosphatase: 55 IU/L (ref 47–123)
BUN/Creatinine Ratio: 10 (ref 9–20)
BUN: 10 mg/dL (ref 6–20)
Bilirubin Total: 0.8 mg/dL (ref 0.0–1.2)
CO2: 27 mmol/L (ref 20–29)
Calcium: 10.4 mg/dL — ABNORMAL HIGH (ref 8.7–10.2)
Chloride: 98 mmol/L (ref 96–106)
Creatinine, Ser: 1.01 mg/dL (ref 0.76–1.27)
Globulin, Total: 2.4 g/dL (ref 1.5–4.5)
Glucose: 80 mg/dL (ref 70–99)
Potassium: 4.5 mmol/L (ref 3.5–5.2)
Sodium: 140 mmol/L (ref 134–144)
Total Protein: 7.8 g/dL (ref 6.0–8.5)
eGFR: 107 mL/min/1.73 (ref >59)

## 2024-03-27 LAB — CBC
Hematocrit: 47.5 % (ref 37.5–51.0)
Hemoglobin: 15.5 g/dL (ref 13.0–17.7)
MCH: 30.8 pg (ref 26.6–33.0)
MCHC: 32.6 g/dL (ref 31.5–35.7)
MCV: 94 fL (ref 79–97)
Platelets: 229 x10E3/uL (ref 150–450)
RBC: 5.04 x10E6/uL (ref 4.14–5.80)
RDW: 11.9 % (ref 11.6–15.4)
WBC: 5.7 x10E3/uL (ref 3.4–10.8)

## 2024-03-29 ENCOUNTER — Ambulatory Visit: Payer: Self-pay | Admitting: Neurology

## 2024-04-03 ENCOUNTER — Telehealth: Payer: Self-pay | Admitting: Neurology

## 2024-04-03 NOTE — Telephone Encounter (Signed)
 Marchelle(Nurse Care navigator)[email or web address]  [employer] ph# [phone] has called to report they have not received the enrollment form for medication, this can be obtained from their website [email or web address]

## 2024-04-07 NOTE — Telephone Encounter (Signed)
 Spoke with Jeanette, nurse at [facility] . She said we will need to also submit a [facility] patient enrollment form in addition to the Rx form and the Rems enrollment form. She said they did not receive the rems enrollment form. I informed her I have faxed it twice with the prescription form and pt's insurance information. They do have the other forms but do not have the Rems enrollment form. I faxed this form a third time to [facility]  rems and I have the [facility] enrollment form ready for MD signature. Jeanette aware Dr Chalice is out of the office until 12/29.   Received a receipt of confirmation.

## 2024-04-07 NOTE — Telephone Encounter (Signed)
 I will call them. This has all been faxed twice to [facility] .

## 2024-04-21 NOTE — Telephone Encounter (Signed)
 Marchelle calling to check on status of enrollment form. Can fax to [phone], Phone: [phone]

## 2024-04-21 NOTE — Telephone Encounter (Signed)
 Form signed, faxed back to [phone], confirmation received.

## 2024-04-29 NOTE — Telephone Encounter (Signed)
 We have received another fax dated 04/25/24 stating there is a missing patient signature however we had previously been told that patient signature could be obtained by the program and I had placed a note on a fax (date sent 04/21/24) asking for them to proceed with obtaining the additional patient signature needed. I have called Mark w/ [employer] ([facility] ) and LVM for call back to hopefully clarify exactly what is needed.

## 2024-04-30 NOTE — Telephone Encounter (Signed)
 Spoke with Dustin Pace @ [facility]. He requested to have all forms (except prescription) faxed to him and he will ensure the forms get to the proper places. I did inform him that we have faxed the forms multiple times to the correct fax numbers.   [facility] and rems enrollment forms faxed to mark. Received a receipt of confirmation. Fax # [phone]

## 2024-05-15 NOTE — Telephone Encounter (Signed)
 Received a fax from [facility] w/ [facility] stating rems form needed and pt auth. I called Mark. I had already faxed the rems form to him on 1/14. He was able to locate this. He also said they had not been able to get in touch with pt for the auth but they had left 3 VM. I called the pt. He said he would give them a call. I gave him the # that Oneil provided me, [phone]. Pt understands this is all that's missing in order to move forward with getting him Lumryz . He thanked me for the call.   Mark w/ [facility] will keep us  informed with pt's progress on Lumryz  new start.

## 2024-05-22 NOTE — Telephone Encounter (Signed)
 Received another fax from [facility] w/ [facility] stating pt still has not given consent. I called the patient. He asked if he could call me back.   The number the pt needs to call is [phone]

## 2024-09-30 ENCOUNTER — Ambulatory Visit: Admitting: Neurology
